# Patient Record
Sex: Male | Born: 1985
Health system: Southern US, Community
[De-identification: ages and names within clinical notes are randomized; demographics above are authoritative.]

## PROBLEM LIST (undated history)

## (undated) DIAGNOSIS — F32A Depression, unspecified: Secondary | ICD-10-CM

## (undated) DIAGNOSIS — F419 Anxiety disorder, unspecified: Secondary | ICD-10-CM

## (undated) DIAGNOSIS — D649 Anemia, unspecified: Secondary | ICD-10-CM

## (undated) DIAGNOSIS — B009 Herpesviral infection, unspecified: Secondary | ICD-10-CM

## (undated) HISTORY — DX: Anemia, unspecified: D64.9

## (undated) HISTORY — DX: Herpesviral infection, unspecified: B00.9

## (undated) HISTORY — DX: Anxiety disorder, unspecified: F41.9

## (undated) HISTORY — DX: Depression, unspecified: F32.A

---

## 2008-02-17 ENCOUNTER — Emergency Department: Payer: Self-pay | Admitting: Emergency Medicine

## 2008-04-17 ENCOUNTER — Emergency Department: Payer: Self-pay | Admitting: Unknown Physician Specialty

## 2008-07-04 ENCOUNTER — Emergency Department: Payer: Self-pay | Admitting: Emergency Medicine

## 2008-09-18 ENCOUNTER — Emergency Department: Payer: Self-pay | Admitting: Emergency Medicine
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
## 2009-12-01 ENCOUNTER — Emergency Department: Payer: Self-pay | Admitting: Emergency Medicine

## 2011-07-11 ENCOUNTER — Ambulatory Visit: Payer: Self-pay | Admitting: Gastroenterology

## 2011-07-12 ENCOUNTER — Ambulatory Visit: Payer: Self-pay | Admitting: Gastroenterology

## 2012-08-19 ENCOUNTER — Encounter (HOSPITAL_COMMUNITY): Payer: Self-pay | Admitting: *Deleted

## 2012-08-19 ENCOUNTER — Emergency Department (HOSPITAL_COMMUNITY)
Admission: EM | Admit: 2012-08-19 | Discharge: 2012-08-19 | Disposition: A | Discharge status: Home / Self Care | Payer: Self-pay | Attending: Emergency Medicine | Admitting: Emergency Medicine

## 2012-08-19 DIAGNOSIS — F17200 Nicotine dependence, unspecified, uncomplicated: Secondary | ICD-10-CM | POA: Insufficient documentation

## 2012-08-19 DIAGNOSIS — J029 Acute pharyngitis, unspecified: Secondary | ICD-10-CM | POA: Insufficient documentation

## 2012-08-19 DIAGNOSIS — R51 Headache: Secondary | ICD-10-CM | POA: Insufficient documentation

## 2012-08-19 LAB — RAPID STREP SCREEN (MED CTR MEBANE ONLY): Streptococcus, Group A Screen (Direct): NEGATIVE

## 2012-08-19 MED ORDER — HYDROCODONE-ACETAMINOPHEN 5-500 MG PO TABS
1.0000 | ORAL_TABLET | Freq: Four times a day (QID) | ORAL | Status: DC | PRN
Start: 2012-08-19 — End: 2023-09-10

## 2012-08-19 NOTE — ED Notes (Signed)
Pt reports 2 day h/o R flank pain, denies any injury, dysuria or dark-colored urine.  Pt denies any urinary or fecal incontinence.

## 2012-08-19 NOTE — ED Provider Notes (Addendum)
History   This chart was scribed for Doug Sou, MD by Toya Smothers, ED Scribe. The patient was seen in room TR05C/TR05C. Patient's care was started at 1344.  CSN: [id number]  Arrival date & time 08/19/12  1344   First MD Initiated Contact with Patient 08/19/12 1437      Chief Complaint  Patient presents with  . Back Pain  . Sore Throat     Patient is a 26 y.o. male presenting with back pain and pharyngitis. The history is provided by the patient. No language interpreter was used.  Back Pain  This is a recurrent problem. The current episode started 2 days ago. The problem occurs constantly. The problem has not changed since onset.The pain is associated with no known injury. The pain is present in the lumbar spine. The quality of the pain is described as stabbing. The pain does not radiate. The pain is moderate. The symptoms are aggravated by twisting and certain positions. The pain is the same all the time. Associated symptoms include headaches. Pertinent negatives include no fever. He has tried nothing for the symptoms. The treatment provided no relief. Risk factors: chronic back pain.  Sore Throat This is a new problem. The current episode started 6 to 12 hours ago. The problem occurs constantly. The problem has not changed since onset.Associated symptoms include headaches. Nothing aggravates the symptoms. Nothing relieves the symptoms. He has tried nothing for the symptoms. The treatment provided no relief.   patient has history of chronic back pain. Correction sore throat started yesterday. Worse with swallowing also missed a cough since yesterday  Mark Bender is a 26 y.o. male who presents to the Emergency Department complaining of 2 days of constant, unchanged, moderate right side lumbar pain. Pain is sharp, aggravated with certain position, and similar to previous episodes of recurrent chronic back pain. No injury mechanism. Pt reports no relief to pain despite the use of  tylenol. He also c/o 12 hours of constant, worsening, moderate sore throat. No fever, chills, cough, congestion, rhinorrhea, chest pain, SOB, or n/v/d. Medical Hx includes chronic lower back pain due to 2 bulging disk. No pertinent surgical Hx listed. Pt is a current everyday smoker and denies consumption of alcohol and use of illicit drugs.   History reviewed. No pertinent past medical history. Past medical history bulging discs in back (lumbar area) History reviewed. No pertinent past surgical history.  History reviewed. No pertinent family history.  History  Substance Use Topics  . Smoking status: Current Every Day Smoker    Types: Cigarettes  . Smokeless tobacco: Not on file  . Alcohol Use: No   Review of Systems  Constitutional: Negative.  Negative for fever.  HENT: Positive for sore throat.   Respiratory: Negative.   Cardiovascular: Negative.   Gastrointestinal: Negative.   Musculoskeletal: Positive for back pain.  Skin: Negative.   Neurological: Positive for headaches.  Hematological: Negative.   Psychiatric/Behavioral: Negative.     Allergies  Review of patient's allergies indicates no known allergies.  Home Medications   Current Outpatient Rx  Name  Route  Sig  Dispense  Refill  . ACETAMINOPHEN 500 MG PO TABS   Oral   Take 500 mg by mouth every 6 (six) hours as needed. For pain           BP 121/72  Pulse 118  Temp 97.6 F (36.4 C) (Oral)  Resp 14  SpO2 98%  Physical Exam  Nursing note and vitals reviewed. Constitutional: He appears  well-developed and well-nourished.  HENT:  Head: Normocephalic and atraumatic.  Mouth/Throat: No oropharyngeal exudate.       Tonsils large and red. Uvula midline. No tonsillar exudate  Eyes: Conjunctivae normal are normal. Pupils are equal, round, and reactive to light.  Neck: Neck supple. No tracheal deviation present. No thyromegaly present.  Cardiovascular: Regular rhythm.   No murmur heard.      Mild tachycardic    Pulmonary/Chest: Effort normal and breath sounds normal.  Abdominal: Soft. Bowel sounds are normal. He exhibits no distension. There is no tenderness.  Musculoskeletal: Normal range of motion. He exhibits no edema.       Mild paralumbar tenderness  Lymphadenopathy:    He has cervical adenopathy.  Neurological: He is alert. Coordination normal.  Skin: Skin is warm and dry. No rash noted.  Psychiatric: He has a normal mood and affect.    ED Course  Procedures  DIAGNOSTIC STUDIES: Oxygen Saturation is 98% on room air, normal by my interpretation.    COORDINATION OF CARE:  14:58-Evaluated Pt. Pt is awake, alert, and without distress. Pt arrived via  Personal transport and will be driving home. 15:00- Patient informed of clinical course, understand medical decision-making process, and agree with plan. Advised Pt to discontinue tobacco use.    Labs Reviewed  RAPID STREP SCREEN   No results found.  Results for orders placed during the hospital encounter of 08/19/12  RAPID STREP SCREEN      Component Value Range   Streptococcus, Group A Screen (Direct) NEGATIVE  NEGATIVE   No results found.  No diagnosis found.   Declined pain medicine in ED MDM  Plan prescription Vicodin Symptoms felt to be viral in etiology Smoking cessation encouraged Referral [facility] [facility] or resource guide Dx #1 pharyngitis #2 low back pain  I personally performed the services described in this documentation, which was scribed in my presence. The recorded information has been reviewed and is accurate.         Doug Sou, MD 08/19/12 1517  Doug Sou, MD 08/19/12 1521

## 2012-08-19 NOTE — ED Notes (Signed)
To ED for eval of lower back pain and sore throat pain. No injury to back. Hx of back pain

## 2015-04-03 ENCOUNTER — Other Ambulatory Visit (HOSPITAL_COMMUNITY): Payer: Self-pay | Admitting: Gastroenterology

## 2015-04-03 DIAGNOSIS — B182 Chronic viral hepatitis C: Secondary | ICD-10-CM

## 2015-04-19 ENCOUNTER — Telehealth (HOSPITAL_COMMUNITY): Payer: Self-pay

## 2015-04-19 NOTE — Telephone Encounter (Signed)
Called to remind pt of 7am appt at [facility] on 04/20/15. Pt agreed to stay NPO in prep for exam. AW

## 2015-04-20 ENCOUNTER — Ambulatory Visit (HOSPITAL_COMMUNITY)
Admission: RE | Admit: 2015-04-20 | Discharge: 2015-04-20 | Disposition: A | Discharge status: Home / Self Care | Payer: 59 | Source: Ambulatory Visit | Attending: Gastroenterology | Admitting: Gastroenterology

## 2015-04-20 DIAGNOSIS — B182 Chronic viral hepatitis C: Secondary | ICD-10-CM | POA: Insufficient documentation

## 2018-10-26 DIAGNOSIS — Z23 Encounter for immunization: Secondary | ICD-10-CM | POA: Diagnosis not present

## 2018-11-02 DIAGNOSIS — F439 Reaction to severe stress, unspecified: Secondary | ICD-10-CM | POA: Diagnosis not present

## 2018-11-09 ENCOUNTER — Other Ambulatory Visit (HOSPITAL_BASED_OUTPATIENT_CLINIC_OR_DEPARTMENT_OTHER): Payer: Self-pay | Admitting: Family Medicine

## 2018-11-09 DIAGNOSIS — N50811 Right testicular pain: Secondary | ICD-10-CM

## 2018-11-09 DIAGNOSIS — B360 Pityriasis versicolor: Secondary | ICD-10-CM | POA: Diagnosis not present

## 2018-11-10 ENCOUNTER — Ambulatory Visit (HOSPITAL_BASED_OUTPATIENT_CLINIC_OR_DEPARTMENT_OTHER)
Admission: RE | Admit: 2018-11-10 | Discharge: 2018-11-10 | Disposition: A | Discharge status: Home / Self Care | Payer: BLUE CROSS/BLUE SHIELD | Source: Ambulatory Visit | Attending: Family Medicine | Admitting: Family Medicine

## 2018-11-10 DIAGNOSIS — N50811 Right testicular pain: Secondary | ICD-10-CM

## 2018-11-19 DIAGNOSIS — F439 Reaction to severe stress, unspecified: Secondary | ICD-10-CM | POA: Diagnosis not present

## 2019-04-16 DIAGNOSIS — M79644 Pain in right finger(s): Secondary | ICD-10-CM | POA: Diagnosis not present

## 2019-09-24 HISTORY — PX: OTHER SURGICAL HISTORY: SHX169

## 2020-05-23 ENCOUNTER — Ambulatory Visit: Payer: Self-pay | Attending: Critical Care Medicine

## 2020-05-23 DIAGNOSIS — Z23 Encounter for immunization: Secondary | ICD-10-CM

## 2020-05-23 NOTE — Progress Notes (Signed)
   Covid-19 Vaccination Clinic  Name:  Mark Bender    MRN: [id number] DOB: [birth year]  05/23/2020  Mr. Mark Bender was observed post Covid-19 immunization for 15 minutes without incident. He was provided with Vaccine Information Sheet and instruction to access the V-Safe system.   Mr. Mark Bender was instructed to call 911 with any severe reactions post vaccine: Marland Kitchen Difficulty breathing  . Swelling of face and throat  . A fast heartbeat  . A bad rash all over body  . Dizziness and weakness   Immunizations Administered    Name Date Dose VIS Date Route   Moderna COVID-19 Vaccine 05/23/2020  1:49 PM 0.5 mL 08/2019 Intramuscular   Manufacturer: Moderna   Lot: [id number]   NDC: 76734-193-79

## 2020-06-20 ENCOUNTER — Ambulatory Visit: Payer: Self-pay

## 2021-01-28 IMAGING — US US SCROTUM W/ DOPPLER COMPLETE
1 series · 14 of 25 positions shown · non-contrast
Comparison: None

CLINICAL DATA: RIGHT testicular pain for 1 week question torsion

EXAM:
SCROTAL ULTRASOUND
DOPPLER ULTRASOUND OF THE TESTICLES
TECHNIQUE: Complete ultrasound examination of the testicles, epididymis, and
other scrotal structures was performed. Color and spectral Doppler
ultrasound were also utilized to evaluate blood flow to the
testicles.

[Series 1: us scrotum w/ doppler complete · 0.07mm/px · 14 of 55 slices shown]
[im 1/55]
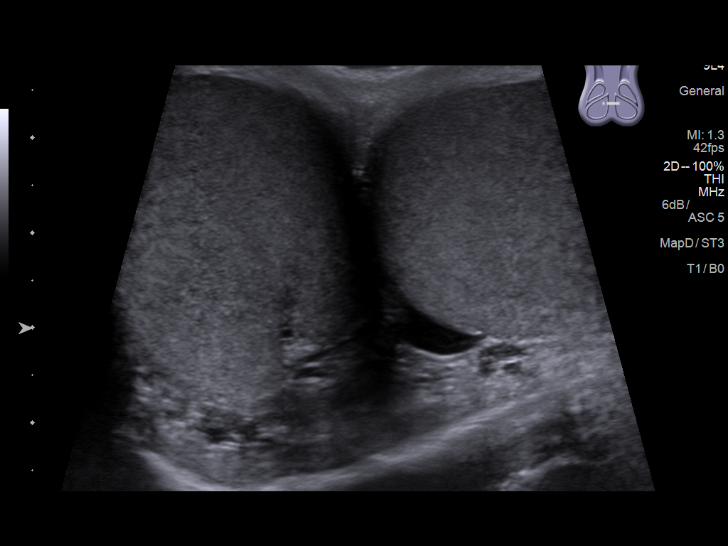
[im 5/55]
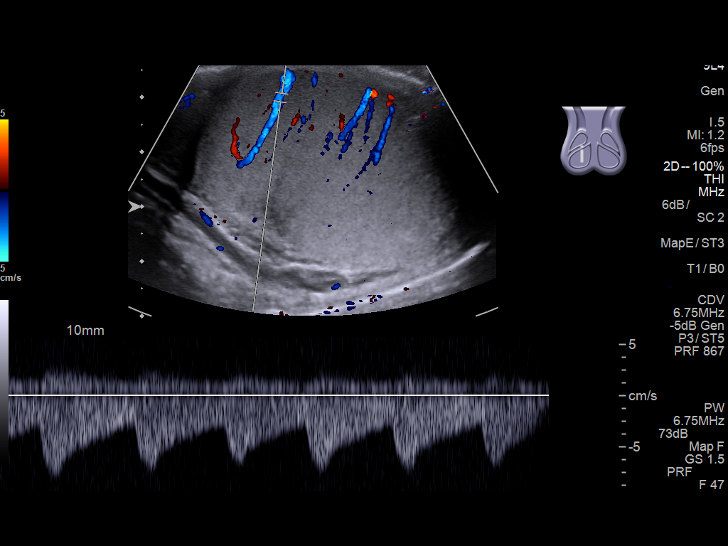
[im 10/55]
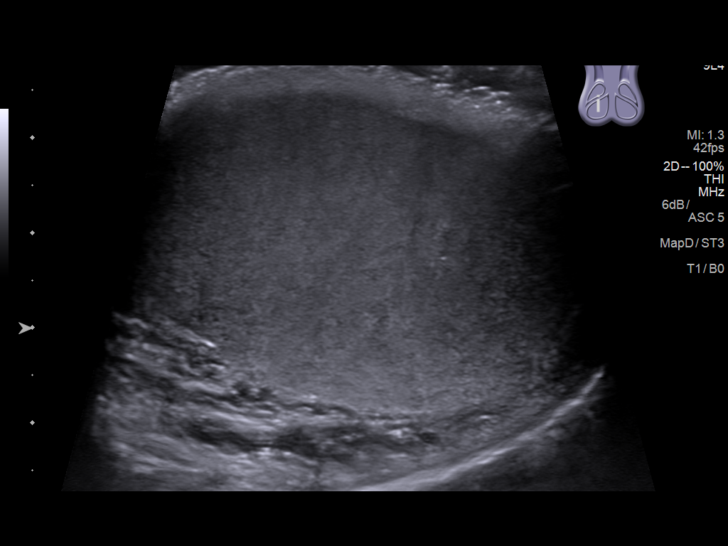
[im 14/55]
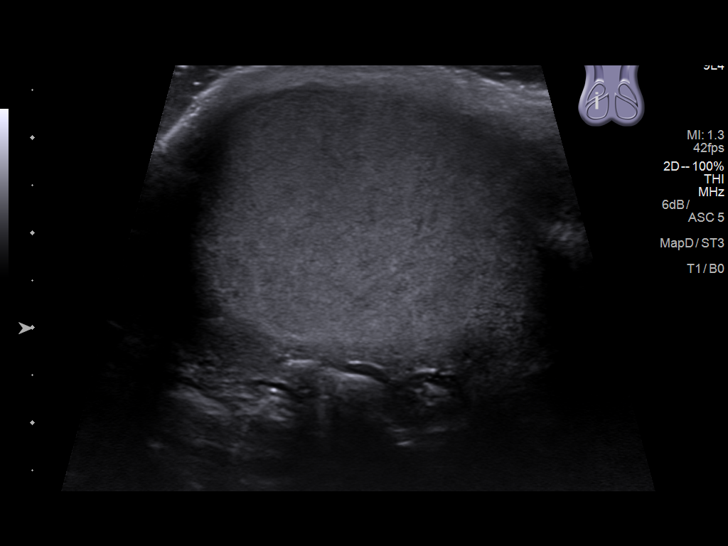
[im 19/55]
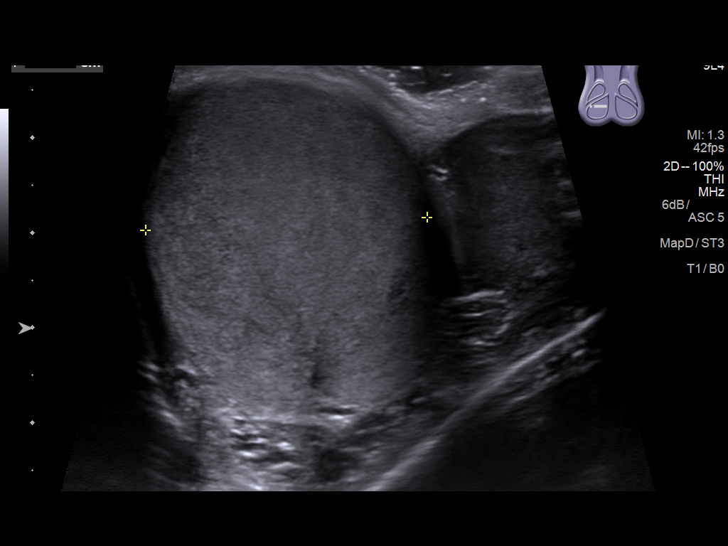
[im 21/55]
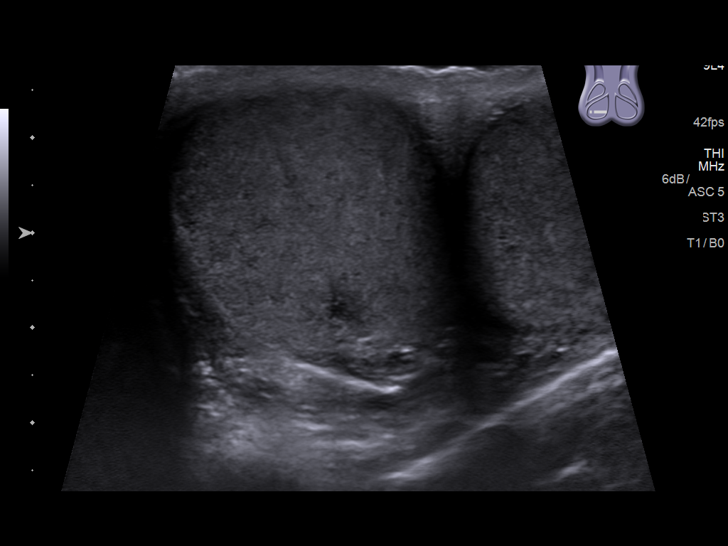
[im 25/55]
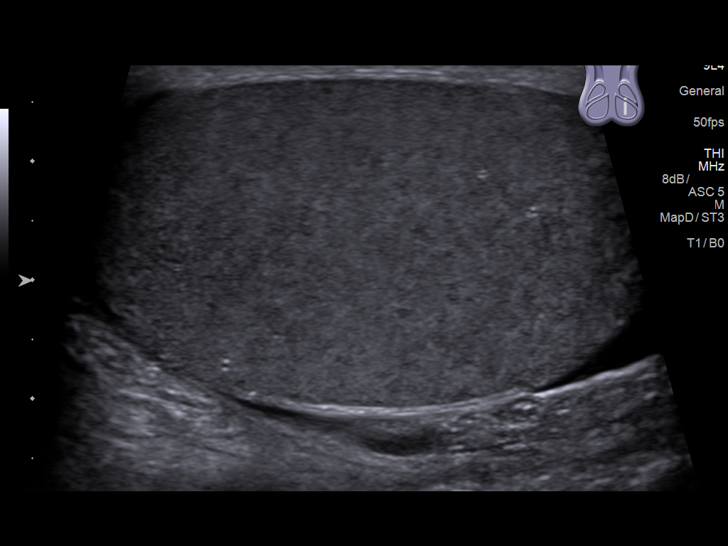
[im 30/55]
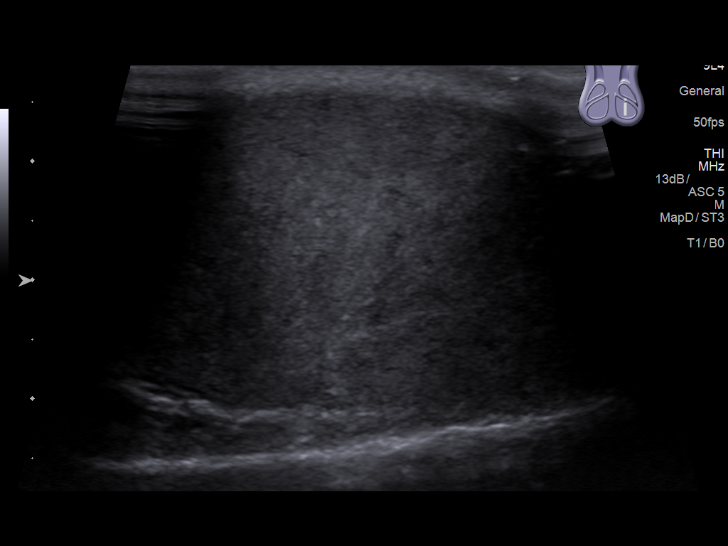
[im 34/55]
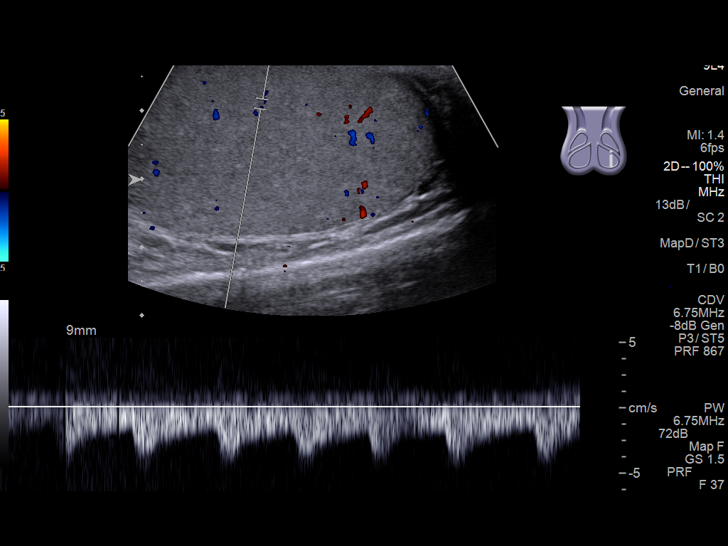
[im 37/55]
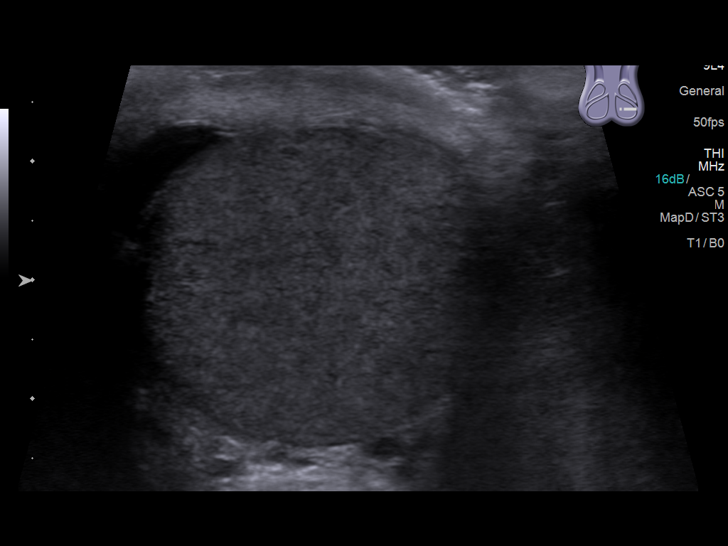
[im 41/55]
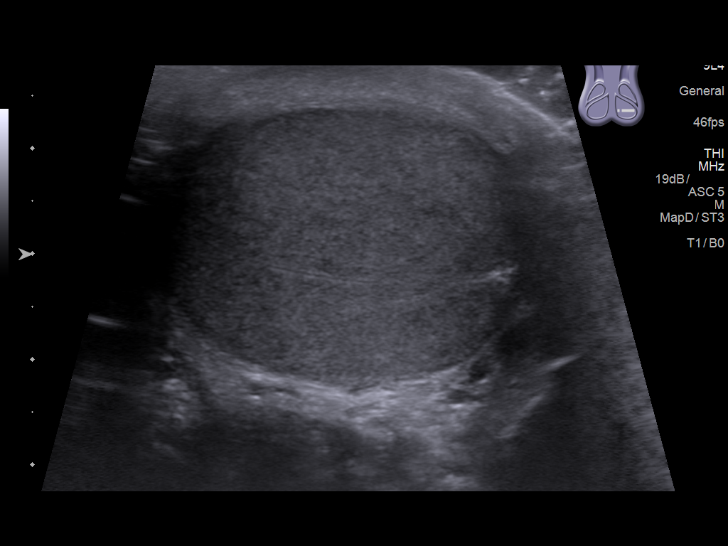
[im 46/55]
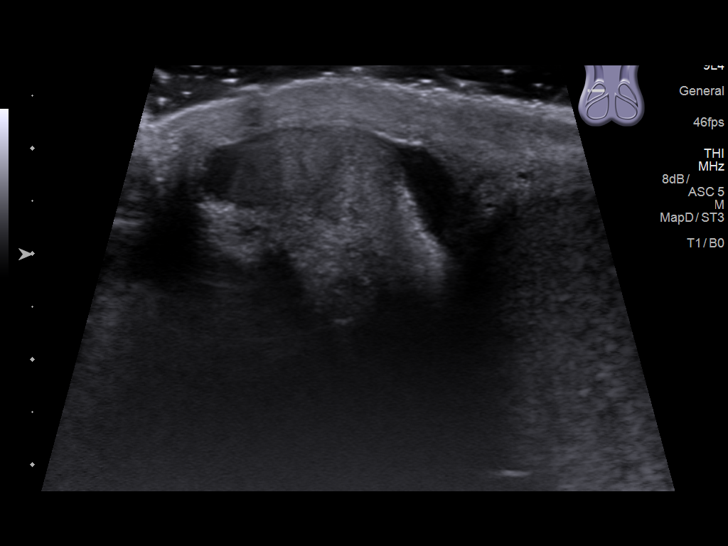
[im 50/55]
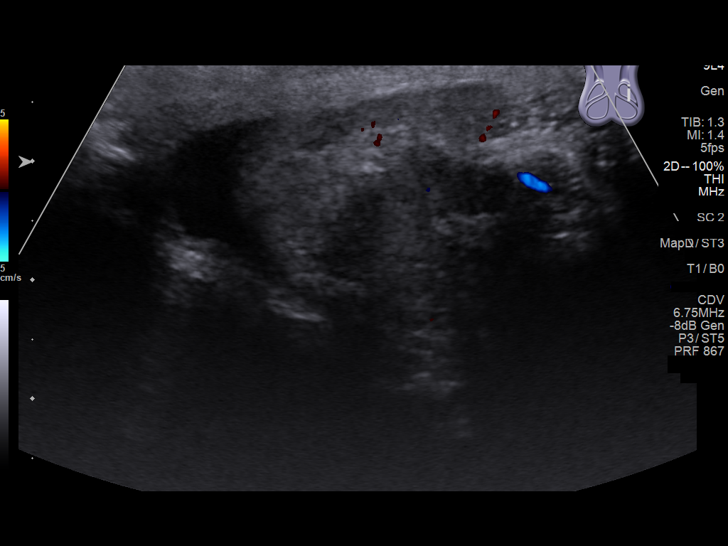
[im 55/55]
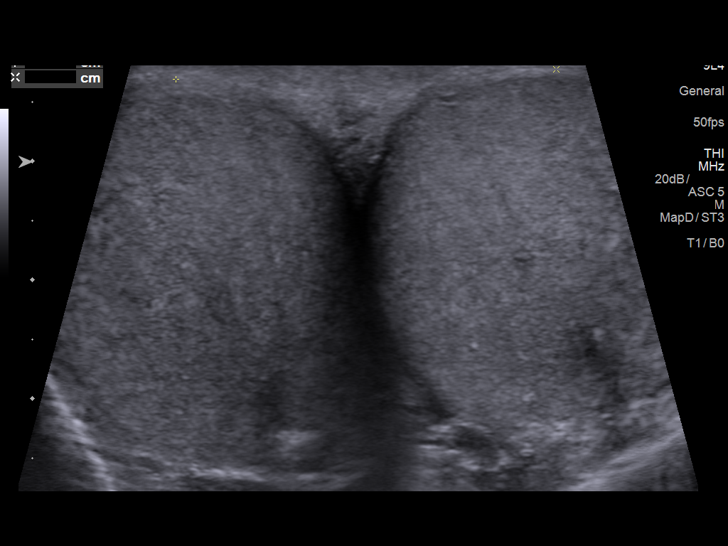

[14 of 25 positions shown; findings below may reference images not displayed]

FINDINGS: Right testicle

Measurements: 4.8 x 3.4 x 3.0 cm. Normal echogenicity without mass
or calcification. Internal blood flow present on color Doppler
imaging.

Left testicle

Measurements: 4.7 x 2.7 x 3.2 cm. Normal echogenicity without mass
or calcification. Internal blood flow present on color Doppler
imaging, similar to RIGHT

Right epididymis:  Normal in size and appearance.

Left epididymis:  Normal in size and appearance.

Hydrocele:  Tiny BILATERAL hydroceles.

Varicocele:  None visualized.

Pulsed Doppler interrogation of both testes demonstrates normal low
resistance arterial and venous waveforms bilaterally.
IMPRESSION: No evidence of testicular mass, torsion, or definite inflammatory
process.

## 2021-10-25 DIAGNOSIS — F1120 Opioid dependence, uncomplicated: Secondary | ICD-10-CM | POA: Diagnosis not present

## 2021-11-01 DIAGNOSIS — M654 Radial styloid tenosynovitis [de Quervain]: Secondary | ICD-10-CM | POA: Diagnosis not present

## 2021-11-01 DIAGNOSIS — M25532 Pain in left wrist: Secondary | ICD-10-CM | POA: Diagnosis not present

## 2021-11-26 DIAGNOSIS — F1120 Opioid dependence, uncomplicated: Secondary | ICD-10-CM | POA: Diagnosis not present

## 2022-01-21 DIAGNOSIS — F1120 Opioid dependence, uncomplicated: Secondary | ICD-10-CM | POA: Diagnosis not present

## 2022-03-27 DIAGNOSIS — Z8659 Personal history of other mental and behavioral disorders: Secondary | ICD-10-CM | POA: Diagnosis not present

## 2022-03-27 DIAGNOSIS — F1120 Opioid dependence, uncomplicated: Secondary | ICD-10-CM | POA: Diagnosis not present

## 2022-04-15 DIAGNOSIS — F3342 Major depressive disorder, recurrent, in full remission: Secondary | ICD-10-CM | POA: Diagnosis not present

## 2022-04-15 DIAGNOSIS — F419 Anxiety disorder, unspecified: Secondary | ICD-10-CM | POA: Diagnosis not present

## 2022-04-15 DIAGNOSIS — H60543 Acute eczematoid otitis externa, bilateral: Secondary | ICD-10-CM | POA: Diagnosis not present

## 2022-04-30 DIAGNOSIS — M654 Radial styloid tenosynovitis [de Quervain]: Secondary | ICD-10-CM | POA: Diagnosis not present

## 2022-05-01 DIAGNOSIS — N451 Epididymitis: Secondary | ICD-10-CM

## 2022-05-01 HISTORY — DX: Epididymitis: N45.1

## 2022-05-02 DIAGNOSIS — R350 Frequency of micturition: Secondary | ICD-10-CM | POA: Diagnosis not present

## 2022-05-02 DIAGNOSIS — N451 Epididymitis: Secondary | ICD-10-CM | POA: Diagnosis not present

## 2022-05-21 DIAGNOSIS — F4323 Adjustment disorder with mixed anxiety and depressed mood: Secondary | ICD-10-CM | POA: Diagnosis not present

## 2022-05-21 DIAGNOSIS — F1911 Other psychoactive substance abuse, in remission: Secondary | ICD-10-CM | POA: Diagnosis not present

## 2022-05-22 DIAGNOSIS — Z8659 Personal history of other mental and behavioral disorders: Secondary | ICD-10-CM | POA: Diagnosis not present

## 2022-05-22 DIAGNOSIS — F1120 Opioid dependence, uncomplicated: Secondary | ICD-10-CM | POA: Diagnosis not present

## 2022-06-04 DIAGNOSIS — F4323 Adjustment disorder with mixed anxiety and depressed mood: Secondary | ICD-10-CM | POA: Diagnosis not present

## 2022-06-18 DIAGNOSIS — F1921 Other psychoactive substance dependence, in remission: Secondary | ICD-10-CM | POA: Diagnosis not present

## 2022-06-18 DIAGNOSIS — F4323 Adjustment disorder with mixed anxiety and depressed mood: Secondary | ICD-10-CM | POA: Diagnosis not present

## 2022-07-16 DIAGNOSIS — F1120 Opioid dependence, uncomplicated: Secondary | ICD-10-CM | POA: Diagnosis not present

## 2022-09-10 DIAGNOSIS — F1120 Opioid dependence, uncomplicated: Secondary | ICD-10-CM | POA: Diagnosis not present

## 2022-09-10 DIAGNOSIS — Z8659 Personal history of other mental and behavioral disorders: Secondary | ICD-10-CM | POA: Diagnosis not present

## 2022-10-25 DIAGNOSIS — F1620 Hallucinogen dependence, uncomplicated: Secondary | ICD-10-CM | POA: Diagnosis not present

## 2022-10-25 DIAGNOSIS — F1390 Sedative, hypnotic, or anxiolytic use, unspecified, uncomplicated: Secondary | ICD-10-CM | POA: Diagnosis not present

## 2022-10-25 DIAGNOSIS — F1220 Cannabis dependence, uncomplicated: Secondary | ICD-10-CM | POA: Insufficient documentation

## 2022-10-25 DIAGNOSIS — F1994 Other psychoactive substance use, unspecified with psychoactive substance-induced mood disorder: Secondary | ICD-10-CM | POA: Diagnosis not present

## 2022-10-25 DIAGNOSIS — F1420 Cocaine dependence, uncomplicated: Secondary | ICD-10-CM | POA: Diagnosis not present

## 2022-10-28 DIAGNOSIS — Z0000 Encounter for general adult medical examination without abnormal findings: Secondary | ICD-10-CM | POA: Diagnosis not present

## 2022-10-28 DIAGNOSIS — F3342 Major depressive disorder, recurrent, in full remission: Secondary | ICD-10-CM | POA: Diagnosis not present

## 2022-10-28 DIAGNOSIS — M545 Low back pain, unspecified: Secondary | ICD-10-CM | POA: Diagnosis not present

## 2022-10-28 DIAGNOSIS — R6882 Decreased libido: Secondary | ICD-10-CM | POA: Diagnosis not present

## 2022-10-28 DIAGNOSIS — F419 Anxiety disorder, unspecified: Secondary | ICD-10-CM | POA: Diagnosis not present

## 2022-11-06 DIAGNOSIS — F1120 Opioid dependence, uncomplicated: Secondary | ICD-10-CM | POA: Diagnosis not present

## 2022-11-06 DIAGNOSIS — Z8659 Personal history of other mental and behavioral disorders: Secondary | ICD-10-CM | POA: Diagnosis not present

## 2022-12-31 DIAGNOSIS — F1120 Opioid dependence, uncomplicated: Secondary | ICD-10-CM | POA: Diagnosis not present

## 2022-12-31 DIAGNOSIS — Z8659 Personal history of other mental and behavioral disorders: Secondary | ICD-10-CM | POA: Diagnosis not present

## 2023-02-11 DIAGNOSIS — F1120 Opioid dependence, uncomplicated: Secondary | ICD-10-CM | POA: Diagnosis not present

## 2023-04-22 DIAGNOSIS — F1120 Opioid dependence, uncomplicated: Secondary | ICD-10-CM | POA: Diagnosis not present

## 2023-05-21 DIAGNOSIS — F1120 Opioid dependence, uncomplicated: Secondary | ICD-10-CM | POA: Diagnosis not present

## 2023-06-17 DIAGNOSIS — F1120 Opioid dependence, uncomplicated: Secondary | ICD-10-CM | POA: Diagnosis not present

## 2023-08-05 DIAGNOSIS — F1120 Opioid dependence, uncomplicated: Secondary | ICD-10-CM | POA: Diagnosis not present

## 2023-08-13 DIAGNOSIS — F1120 Opioid dependence, uncomplicated: Secondary | ICD-10-CM | POA: Diagnosis not present

## 2023-09-09 NOTE — Progress Notes (Unsigned)
   New Patient Office Visit  Subjective    Patient ID: Mark Bender, male    DOB: [birth year]  Age: 37 y.o. MRN: [id number]  CC: No chief complaint on file.   HPI Mark Bender is a 37 y.o. male presents to establish care ***  Outpatient Encounter Medications as of 09/10/2023  Medication Sig   acetaminophen (TYLENOL) 500 MG tablet Take 500 mg by mouth every 6 (six) hours as needed. For pain   HYDROcodone-acetaminophen (VICODIN) 5-500 MG per tablet Take 1-2 tablets by mouth every 6 (six) hours as needed for pain.   No facility-administered encounter medications on file as of 09/10/2023.    No past medical history on file.  No past surgical history on file.  No family history on file.  Social History   Socioeconomic History   Marital status: Unknown    Spouse name: Not on file   Number of children: Not on file   Years of education: Not on file   Highest education level: Not on file  Occupational History   Not on file  Tobacco Use   Smoking status: Every Day    Types: Cigarettes   Smokeless tobacco: Not on file  Substance and Sexual Activity   Alcohol use: No   Drug use: Not on file   Sexual activity: Not on file  Other Topics Concern   Not on file  Social History Narrative   ** Merged History Encounter **       Social Drivers of Health   Financial Resource Strain: Not on file  Food Insecurity: Not on file  Transportation Needs: Not on file  Physical Activity: Not on file  Stress: Not on file  Social Connections: Not on file  Intimate Partner Violence: Not on file    ROS      Objective    There were no vitals taken for this visit.  Physical Exam  {Labs (Optional):23779}    Assessment & Plan:  There are no diagnoses linked to this encounter.   No follow-ups on file.   Modesto Charon, NP

## 2023-09-10 ENCOUNTER — Ambulatory Visit: Payer: BC Managed Care – PPO | Admitting: General Practice

## 2023-09-10 ENCOUNTER — Encounter: Payer: Self-pay | Admitting: General Practice

## 2023-09-10 VITALS — BP 138/84 | HR 68 | Temp 97.8°F | Ht 73.0 in | Wt 175.0 lb

## 2023-09-10 DIAGNOSIS — F32A Depression, unspecified: Secondary | ICD-10-CM

## 2023-09-10 DIAGNOSIS — R0683 Snoring: Secondary | ICD-10-CM | POA: Diagnosis not present

## 2023-09-10 DIAGNOSIS — Z7689 Persons encountering health services in other specified circumstances: Secondary | ICD-10-CM

## 2023-09-10 DIAGNOSIS — B182 Chronic viral hepatitis C: Secondary | ICD-10-CM

## 2023-09-10 DIAGNOSIS — D649 Anemia, unspecified: Secondary | ICD-10-CM | POA: Diagnosis not present

## 2023-09-10 DIAGNOSIS — F419 Anxiety disorder, unspecified: Secondary | ICD-10-CM

## 2023-09-10 DIAGNOSIS — F1220 Cannabis dependence, uncomplicated: Secondary | ICD-10-CM

## 2023-09-10 DIAGNOSIS — Z8619 Personal history of other infectious and parasitic diseases: Secondary | ICD-10-CM

## 2023-09-10 DIAGNOSIS — R350 Frequency of micturition: Secondary | ICD-10-CM

## 2023-09-10 MED ORDER — VALACYCLOVIR HCL 1 G PO TABS: 1000.0000 mg | ORAL_TABLET | Freq: Every day | ORAL | 3 refills | Status: DC

## 2023-09-10 MED ORDER — BUPROPION HCL ER (XL) 300 MG PO TB24: 300.0000 mg | ORAL_TABLET | Freq: Every day | ORAL | 3 refills | Status: DC

## 2023-09-10 NOTE — Assessment & Plan Note (Signed)
Unclear etiology.   Does not want to be referred to urology at this time.   Will continue to monitor.

## 2023-09-10 NOTE — Patient Instructions (Signed)
Referral has been sent for counselor. They will call you with more information.   Refills sent.   Schedule a physical for February.   It was a pleasure meeting you!

## 2023-09-10 NOTE — Assessment & Plan Note (Signed)
Controlled.   Continue valacyclovir 1000 mg once daily. Refill sent.

## 2023-09-10 NOTE — Assessment & Plan Note (Signed)
History of anemia. Controlled.   Will check labs at physical to ensure hemoglobin is WNL.

## 2023-09-10 NOTE — Assessment & Plan Note (Signed)
Controlled.  

## 2023-09-10 NOTE — Assessment & Plan Note (Signed)
Unclear etiology.   Recommended to patient to avoid drinking caffeine after 12.  Referral placed for sleep study.

## 2023-09-10 NOTE — Assessment & Plan Note (Signed)
EMR reviewed briefly.

## 2023-09-10 NOTE — Assessment & Plan Note (Signed)
Controlled.      09/10/2023   12:41 PM  GAD 7 : Generalized Anxiety Score  Nervous, Anxious, on Edge 1  Control/stop worrying 0  Worry too much - different things 0  Trouble relaxing 1  Restless 1  Easily annoyed or irritable 1  Afraid - awful might happen 0  Total GAD 7 Score 4  Anxiety Difficulty Not difficult at all       09/10/2023   12:41 PM 09/10/2023   12:08 PM  PHQ9 SCORE ONLY  PHQ-9 Total Score 5 0   Continue Wellbutrin. Refill sent.  Referral placed for therapy.

## 2023-10-01 DIAGNOSIS — F4323 Adjustment disorder with mixed anxiety and depressed mood: Secondary | ICD-10-CM | POA: Diagnosis not present

## 2023-10-07 DIAGNOSIS — F1120 Opioid dependence, uncomplicated: Secondary | ICD-10-CM | POA: Diagnosis not present

## 2023-10-13 DIAGNOSIS — F4323 Adjustment disorder with mixed anxiety and depressed mood: Secondary | ICD-10-CM | POA: Diagnosis not present

## 2023-11-05 DIAGNOSIS — F4323 Adjustment disorder with mixed anxiety and depressed mood: Secondary | ICD-10-CM | POA: Diagnosis not present

## 2023-11-19 ENCOUNTER — Encounter: Payer: Self-pay | Admitting: Nurse Practitioner

## 2023-11-19 ENCOUNTER — Ambulatory Visit: Payer: BC Managed Care – PPO | Admitting: Nurse Practitioner

## 2023-11-19 VITALS — BP 116/73 | HR 86 | Temp 97.5°F | Ht 73.0 in | Wt 175.8 lb

## 2023-11-19 DIAGNOSIS — G479 Sleep disorder, unspecified: Secondary | ICD-10-CM | POA: Diagnosis not present

## 2023-11-19 DIAGNOSIS — R0683 Snoring: Secondary | ICD-10-CM | POA: Diagnosis not present

## 2023-11-19 DIAGNOSIS — G4719 Other hypersomnia: Secondary | ICD-10-CM | POA: Diagnosis not present

## 2023-11-19 NOTE — Progress Notes (Unsigned)
 @Patient  ID: Mark Bender, male    DOB: 1986-02-25, 38 y.o.   MRN: [id number]  Chief Complaint  Patient presents with   Follow-up    Referring provider: Modesto Charon, NP  HPI: 38 year old male, former smoker referred for sleep consult. Past medical history significant for chronic hepatitis C, anxiety and depression, HSV.  TEST/EVENTS:   11/19/2023: Today - sleep consult Discussed the use of AI scribe software for clinical note transcription with the patient, who gave verbal consent to proceed.  History of Present Illness   Mark Bender is a 38 year old male who presents with sleep disturbances and snoring for a sleep consult.  He experiences intense snoring throughout the night, which he has recorded using an app on his watch. Despite his wife being a heavy sleeper and not disturbed by the snoring, he is concerned about the severity after listening to the recordings. He also experiences significant sleep disturbances, including frequent awakenings, restlessness, and persistent fatigue even after eight hours of sleep.  He experiences daytime sleepiness, particularly during meetings at work when he is not actively speaking, and has a tendency to doze off. He feels drowsy while driving, especially during his commute to [place] three times a week. He has not had any accidents but acknowledges nodding off occasionally during these drives.   There is a family history of sleep apnea, as his father was diagnosed with the condition and used CPAP therapy. He is of a similar build to him.  He has experienced sleep paralysis in the past, but not recently.  His caffeine intake includes approximately 20 ounces of coffee in the morning, usually finished by 9:30 AM, and another cup around 1:30 to 2:00 PM. He denies alcohol consumption. No morning headaches, current sleepwalking, or recent episodes of sleep paralysis.   He goes to bed between 930 and 10 PM.  Falls asleep within 30  minutes.  Wakes 4-6 times a night.  Gets up at around 5 or 6 AM.  Does not operate any heavy machinery in his job field but does have to commute far.  Never had a previous sleep study.  No significant weight change over the last 2 years.  No supplemental oxygen use.  Relatively healthy otherwise.  Lives with his wife and 2 sons.  Works as a Database administrator.  [place] [address]      No Known Allergies  Immunization History  Administered Date(s) Administered   Influenza, Quadrivalent, Recombinant, Inj, Pf 07/04/2022   Influenza,inj,Quad PF,6+ Mos 08/23/2019, 06/07/2020, 08/24/2021   Influenza,inj,Quad PF,6-35 Mos 08/23/2019, 06/07/2020   Influenza-Unspecified 04/24/2023   MMR 10/26/2018   Moderna Sars-Covid-2 Vaccination 09/30/2019, 10/28/2019, 05/23/2020   Tdap 10/26/2018    Past Medical History:  Diagnosis Date   Anemia    Anxiety and depression    Epididymitis 05/01/2022   Herpes simplex type 2 infection     Tobacco History: Social History   Tobacco Use  Smoking Status Former   Types: Cigarettes  Smokeless Tobacco Current  Tobacco Comments   Nicotine pouch    Ready to quit: Not Answered Counseling given: Not Answered Tobacco comments: Nicotine pouch    Outpatient Medications Prior to Visit  Medication Sig Dispense Refill   buPROPion (WELLBUTRIN XL) 300 MG 24 hr tablet Take 1 tablet (300 mg total) by mouth daily. 90 tablet 3   valACYclovir (VALTREX) 1000 MG tablet Take 1 tablet (1,000 mg total) by mouth daily. 90 tablet 3   No facility-administered medications  prior to visit.     Review of Systems:   Constitutional: No weight loss or gain, night sweats, fevers, chills, or lassitude. _+fatigue  HEENT: No headaches, difficulty swallowing, tooth/dental problems, or sore throat. No sneezing, itching, ear ache, nasal congestion, or post nasal drip CV:  No chest pain, orthopnea, PND, swelling in lower extremities, anasarca, dizziness, palpitations, syncope Resp:  +snoring. No shortness of breath with exertion or at rest. No excess mucus or change in color of mucus. No productive or non-productive. No hemoptysis. No wheezing.  No chest wall deformity GI:  No heartburn, indigestion GU: No nocturia MSK:  No joint pain or swelling.   Neuro: No dizziness or lightheadedness.  Psych: No depression or anxiety. Mood stable. +sleep disturbance    Physical Exam:  BP 116/73 (BP Location: Right Arm, Patient Position: Sitting, Cuff Size: Normal)   Pulse 86   Temp (!) 97.5 F (36.4 C) (Temporal)   Ht 6\' 1"  (1.854 m)   Wt 175 lb 12.8 oz (79.7 kg)   SpO2 96%   BMI 23.19 kg/m   GEN: Pleasant, interactive, well-appearing; in no acute distress HEENT:  Normocephalic and atraumatic. PERRLA. Sclera white. Nasal turbinates pink, moist and patent bilaterally. No rhinorrhea present. Oropharynx pink and moist, without exudate or edema. No lesions, ulcerations, or postnasal drip. Mallampati II NECK:  Supple w/ fair ROM. Thyroid symmetrical with no goiter or nodules palpated. No lymphadenopathy.   CV: RRR, no m/r/g, no peripheral edema. Pulses intact, +2 bilaterally. No cyanosis, pallor or clubbing. PULMONARY:  Unlabored, regular breathing. Clear bilaterally A&P w/o wheezes/rales/rhonchi. No accessory muscle use.  GI: BS present and normoactive. Soft, non-tender to palpation. No organomegaly or masses detected.  MSK: No erythema, warmth or tenderness. Cap refil <2 sec all extrem. No deformities or joint swelling noted.  Neuro: A/Ox3. No focal deficits noted.   Skin: Warm, no lesions or rashe Psych: Normal affect and behavior. Judgement and thought content appropriate.     Lab Results:  CBC No results found for: "WBC", "RBC", "HGB", "HCT", "PLT", "MCV", "MCH", "MCHC", "RDW", "LYMPHSABS", "MONOABS", "EOSABS", "BASOSABS"  BMET No results found for: "NA", "K", "CL", "CO2", "GLUCOSE", "BUN", "CREATININE", "CALCIUM", "GFRNONAA", "GFRAA"  BNP No results found for:  "BNP"   Imaging:  No results found.  Administration History     None           No data to display          No results found for: "NITRICOXIDE"      Assessment & Plan:   Snoring He has snoring, excessive daytime sleepiness, restless sleep. Family history of OSA. Given this,  I am concerned he could have sleep disordered breathing with obstructive sleep apnea. He will need sleep study for further evaluation.    - discussed how weight can impact sleep and risk for sleep disordered breathing - discussed options to assist with weight loss: combination of diet modification, cardiovascular and strength training exercises   - had an extensive discussion regarding the adverse health consequences related to untreated sleep disordered breathing - specifically discussed the risks for hypertension, coronary artery disease, cardiac dysrhythmias, cerebrovascular disease, and diabetes - lifestyle modification discussed   - discussed how sleep disruption can increase risk of accidents, particularly when driving - safe driving practices were discussed. Advised to pull over if he has any signs of decreased alertness and avoid driving if drowsy   Patient Instructions  Given your symptoms, I am concerned that you may have sleep disordered  breathing with sleep apnea. You will need a sleep study for further evaluation. Someone will contact you to schedule this.   We discussed how untreated sleep apnea puts an individual at risk for cardiac arrhthymias, pulm HTN, DM, stroke and increases their risk for daytime accidents. We also briefly reviewed treatment options including weight loss, side sleeping position, oral appliance, CPAP therapy or referral to ENT for possible surgical options  Use caution when driving and pull over if you become sleepy.  Follow up in 6 weeks with Katie Shannia Jacuinde,NP to go over sleep study results, or sooner, if needed. Friday PM virtual clinic      Excessive daytime  sleepiness See above   Advised if symptoms do not improve or worsen, to please contact office for sooner follow up or seek emergency care.   I spent 35 minutes of dedicated to the care of this patient on the date of this encounter to include pre-visit review of records, face-to-face time with the patient discussing conditions above, post visit ordering of testing, clinical documentation with the electronic health record, making appropriate referrals as documented, and communicating necessary findings to members of the patients care team.  Noemi Chapel, NP 11/21/2023  Pt aware and understands NP's role.

## 2023-11-19 NOTE — Patient Instructions (Signed)
 Given your symptoms, I am concerned that you may have sleep disordered breathing with sleep apnea. You will need a sleep study for further evaluation. Someone will contact you to schedule this.   We discussed how untreated sleep apnea puts an individual at risk for cardiac arrhthymias, pulm HTN, DM, stroke and increases their risk for daytime accidents. We also briefly reviewed treatment options including weight loss, side sleeping position, oral appliance, CPAP therapy or referral to ENT for possible surgical options  Use caution when driving and pull over if you become sleepy.  Follow up in 6 weeks with Mark Keene Gilkey,NP to go over sleep study results, or sooner, if needed. Friday PM virtual clinic

## 2023-11-21 ENCOUNTER — Encounter: Payer: Self-pay | Admitting: Nurse Practitioner

## 2023-11-21 DIAGNOSIS — G4719 Other hypersomnia: Secondary | ICD-10-CM | POA: Insufficient documentation

## 2023-11-21 NOTE — Assessment & Plan Note (Signed)
 He has snoring, excessive daytime sleepiness, restless sleep. Family history of OSA. Given this,  I am concerned he could have sleep disordered breathing with obstructive sleep apnea. He will need sleep study for further evaluation.    - discussed how weight can impact sleep and risk for sleep disordered breathing - discussed options to assist with weight loss: combination of diet modification, cardiovascular and strength training exercises   - had an extensive discussion regarding the adverse health consequences related to untreated sleep disordered breathing - specifically discussed the risks for hypertension, coronary artery disease, cardiac dysrhythmias, cerebrovascular disease, and diabetes - lifestyle modification discussed   - discussed how sleep disruption can increase risk of accidents, particularly when driving - safe driving practices were discussed. Advised to pull over if he has any signs of decreased alertness and avoid driving if drowsy   Patient Instructions  Given your symptoms, I am concerned that you may have sleep disordered breathing with sleep apnea. You will need a sleep study for further evaluation. Someone will contact you to schedule this.   We discussed how untreated sleep apnea puts an individual at risk for cardiac arrhthymias, pulm HTN, DM, stroke and increases their risk for daytime accidents. We also briefly reviewed treatment options including weight loss, side sleeping position, oral appliance, CPAP therapy or referral to ENT for possible surgical options  Use caution when driving and pull over if you become sleepy.  Follow up in 6 weeks with Katie Landy Dunnavant,NP to go over sleep study results, or sooner, if needed. Friday PM virtual clinic

## 2023-11-21 NOTE — Assessment & Plan Note (Signed)
 See above

## 2023-11-25 DIAGNOSIS — F1120 Opioid dependence, uncomplicated: Secondary | ICD-10-CM | POA: Diagnosis not present

## 2023-11-26 DIAGNOSIS — F4323 Adjustment disorder with mixed anxiety and depressed mood: Secondary | ICD-10-CM | POA: Diagnosis not present

## 2023-12-03 DIAGNOSIS — F4323 Adjustment disorder with mixed anxiety and depressed mood: Secondary | ICD-10-CM | POA: Diagnosis not present

## 2023-12-19 ENCOUNTER — Encounter

## 2023-12-19 ENCOUNTER — Telehealth: Payer: Self-pay | Admitting: Pulmonary Disease

## 2023-12-19 DIAGNOSIS — G4733 Obstructive sleep apnea (adult) (pediatric): Secondary | ICD-10-CM | POA: Diagnosis not present

## 2023-12-19 DIAGNOSIS — R0683 Snoring: Secondary | ICD-10-CM

## 2023-12-19 DIAGNOSIS — G4719 Other hypersomnia: Secondary | ICD-10-CM

## 2023-12-19 DIAGNOSIS — G479 Sleep disorder, unspecified: Secondary | ICD-10-CM

## 2023-12-19 NOTE — Telephone Encounter (Signed)
 Spoke with patient regarding sleep study result's  Call patient   Sleep study result   Date of study: 12/08/2023   Impression: Mild obstructive sleep apnea with AHI of 9.0   Recommendation: Options of treatment for mild obstructive sleep apnea will include   1.  CPAP therapy if there is significant daytime sleepiness or other comorbidities including history of CVA or cardiac disease -If CPAP is chosen as an option of treatment auto titrating CPAP with a pressure setting of 5-15 will be appropriate   2.  Watchful waiting with emphasis on weight loss measures, sleep position modification to optimize lateral sleep, elevating the head of the bed by about 30 degrees may also help.   3.  An oral device may be fashioned for the treatment of mild sleep disordered breathing, will involve referral to dentist.     Follow-up as previously scheduled   Patient does have a mychart visit 01/15/2024. Patient's voice was understanding.Nothing else further needed.

## 2023-12-19 NOTE — Telephone Encounter (Signed)
 Call patient  Sleep study result  Date of study: 12/08/2023  Impression: Mild obstructive sleep apnea with AHI of 9.0  Recommendation: Options of treatment for mild obstructive sleep apnea will include  1.  CPAP therapy if there is significant daytime sleepiness or other comorbidities including history of CVA or cardiac disease -If CPAP is chosen as an option of treatment auto titrating CPAP with a pressure setting of 5-15 will be appropriate  2.  Watchful waiting with emphasis on weight loss measures, sleep position modification to optimize lateral sleep, elevating the head of the bed by about 30 degrees may also help.  3.  An oral device may be fashioned for the treatment of mild sleep disordered breathing, will involve referral to dentist.   Follow-up as previously scheduled

## 2023-12-24 DIAGNOSIS — F4323 Adjustment disorder with mixed anxiety and depressed mood: Secondary | ICD-10-CM | POA: Diagnosis not present

## 2024-01-07 DIAGNOSIS — F4323 Adjustment disorder with mixed anxiety and depressed mood: Secondary | ICD-10-CM | POA: Diagnosis not present

## 2024-01-15 ENCOUNTER — Telehealth: Payer: BC Managed Care – PPO | Admitting: Nurse Practitioner

## 2024-01-15 DIAGNOSIS — G4733 Obstructive sleep apnea (adult) (pediatric): Secondary | ICD-10-CM

## 2024-01-15 DIAGNOSIS — Z87891 Personal history of nicotine dependence: Secondary | ICD-10-CM

## 2024-01-15 NOTE — Progress Notes (Unsigned)
 Patient ID: Mark Bender, male     DOB: 1986/04/24, 38 y.o.      MRN: [id number]  No chief complaint on file.   Virtual Visit via Video Note  I connected with Mark Bender on 01/16/24 at  2:30 PM EDT by a video enabled telemedicine application and verified that I am speaking with the correct person using two identifiers.  Location: Patient: Home Provider: Office    I discussed the limitations of evaluation and management by telemedicine and the availability of in person appointments. The patient expressed understanding and agreed to proceed.  History of Present Illness: 38 year old male, former smoker followed for sleep apnea. Past medical history significant for chronic hepatitis C, anxiety and depression, HSV.   TEST/EVENTS:  12/08/2023 HST: AHI 9, SpO2 low 85%  11/19/2023: Ov with Doc Mandala NP Discussed the use of AI scribe software for clinical note transcription with the patient, who gave verbal consent to proceed. Mark Bender is a 38 year old male who presents with sleep disturbances and snoring for a sleep consult. He experiences intense snoring throughout the night, which he has recorded using an app on his watch. Despite his wife being a heavy sleeper and not disturbed by the snoring, he is concerned about the severity after listening to the recordings. He also experiences significant sleep disturbances, including frequent awakenings, restlessness, and persistent fatigue even after eight hours of sleep. He experiences daytime sleepiness, particularly during meetings at work when he is not actively speaking, and has a tendency to doze off. He feels drowsy while driving, especially during his commute to [place] three times a week. He has not had any accidents but acknowledges nodding off occasionally during these drives.  There is a family history of sleep apnea, as his father was diagnosed with the condition and used CPAP therapy. He is of a similar build to  him. He has experienced sleep paralysis in the past, but not recently. His caffeine intake includes approximately 20 ounces of coffee in the morning, usually finished by 9:30 AM, and another cup around 1:30 to 2:00 PM. He denies alcohol consumption. No morning headaches, current sleepwalking, or recent episodes of sleep paralysis.  He goes to bed between 930 and 10 PM.  Falls asleep within 30 minutes.  Wakes 4-6 times a night.  Gets up at around 5 or 6 AM.  Does not operate any heavy machinery in his job field but does have to commute far.  Never had a previous sleep study.  No significant weight change over the last 2 years.  No supplemental oxygen use.  Relatively healthy otherwise. Lives with his wife and 2 sons.  Works as a Database administrator. [employer]  01/15/2024: Today - follow up Discussed the use of AI scribe software for clinical note transcription with the patient, who gave verbal consent to proceed.  History of Present Illness   Mark Bender is a 38 year old male who presents for follow up after home sleep study, which revealed mild OSA.   He experiences ongoing sleep disturbances characterized by intense snoring and significant daytime sleepiness. Despite adequate sleep duration, he feels persistently tired during the day. He has recorded himself snoring, which corroborates his symptoms.  A home sleep study revealed mild obstructive sleep apnea.   He is exploring treatment options for his sleep apnea, including the use of an oral appliance.       No Known Allergies Immunization History  Administered Date(s)  Administered   Influenza, Quadrivalent, Recombinant, Inj, Pf 07/04/2022   Influenza,inj,Quad PF,6+ Mos 08/23/2019, 06/07/2020, 08/24/2021   Influenza,inj,Quad PF,6-35 Mos 08/23/2019, 06/07/2020   Influenza-Unspecified 04/24/2023   MMR 10/26/2018   Moderna Sars-Covid-2 Vaccination 09/30/2019, 10/28/2019, 05/23/2020   Tdap 10/26/2018   Past Medical History:   Diagnosis Date   Anemia    Anxiety and depression    Epididymitis 05/01/2022   Herpes simplex type 2 infection     Tobacco History: Social History   Tobacco Use  Smoking Status Former   Types: Cigarettes  Smokeless Tobacco Current  Tobacco Comments   Nicotine pouch    Ready to quit: Not Answered Counseling given: Not Answered Tobacco comments: Nicotine pouch    Outpatient Medications Prior to Visit  Medication Sig Dispense Refill   buPROPion  (WELLBUTRIN  XL) 300 MG 24 hr tablet Take 1 tablet (300 mg total) by mouth daily. 90 tablet 3   valACYclovir  (VALTREX ) 1000 MG tablet Take 1 tablet (1,000 mg total) by mouth daily. 90 tablet 3   No facility-administered medications prior to visit.     Review of Systems:   Constitutional: No weight loss or gain, night sweats, fevers, chills, or lassitude. _+fatigue  HEENT: No headaches, difficulty swallowing, tooth/dental problems, or sore throat. No sneezing, itching, ear ache, nasal congestion, or post nasal drip CV:  No chest pain, orthopnea, PND Resp: +snoring. No shortness of breath with exertion or at rest.  GI:  No heartburn, indigestion GU: No nocturia MSK:  No joint pain or swelling.   Neuro: No dizziness or lightheadedness.  Psych: No depression or anxiety. Mood stable. +sleep disturbance  Observations/Objective: Patient is well-developed, well-nourished in no acute distress.  Resting comfortably at home.  No labored breathing.  Speech is clear and coherent with logical content.  Patient is alert and oriented at baseline.   Assessment and Plan: Mild obstructive sleep apnea Mild OSA with significant daytime burden.  Minimal cardiovascular risks associated with mild sleep apnea.  Discussed potential treatment options including oral appliance or CPAP therapy.  BMI normal.  He has opted to look at oral appliance.  If this is not an affordable option, he would then consider CPAP.  He is already discussed having an oral  appliance made with his dentist at [facility] and [address].  Needs OV notes and home sleep study faxed to them.  He will notify if he needs anything else from us  in the interim.  Safe driving practices reviewed.  Patient Instructions  Your sleep study showed mild sleep apnea. Mild sleep apnea poses minimal cardiovascular health risks, but some people are more sensitive to it than others and have burdensome symptoms as a result.  We also briefly reviewed treatment options including weight loss, side sleeping position, oral appliance, CPAP therapy  You have opted for oral appliance - I will route the home sleep study and office notes to your requested dentist at [facility]   Let me know if you have any issues obtaining this due to insurance coverage, or if you decide you'd like to try CPAP instead   Follow up in 6 months with Gina Lagos, or sooner, if needed   [facility]  Dental Sleep Medicine Fax [phone]    I discussed the assessment and treatment plan with the patient. The patient was provided an opportunity to ask questions and all were answered. The patient agreed with the plan and demonstrated an understanding of the instructions.   The patient was advised  to call back or seek an in-person evaluation if the symptoms worsen or if the condition fails to improve as anticipated.  I provided 31 minutes of non-face-to-face time during this encounter.   Roetta Clarke, NP

## 2024-01-15 NOTE — Patient Instructions (Signed)
 Your sleep study showed mild sleep apnea. Mild sleep apnea poses minimal cardiovascular health risks, but some people are more sensitive to it than others and have burdensome symptoms as a result.  We also briefly reviewed treatment options including weight loss, side sleeping position, oral appliance, CPAP therapy  You have opted for oral appliance - I will route the home sleep study and office notes to your requested dentist at [facility]   Let me know if you have any issues obtaining this due to insurance coverage, or if you decide you'd like to try CPAP instead   Follow up in 6 months with Katie Nakea Gouger,NP, or sooner, if needed

## 2024-01-16 ENCOUNTER — Encounter: Payer: Self-pay | Admitting: Nurse Practitioner

## 2024-01-16 DIAGNOSIS — G4733 Obstructive sleep apnea (adult) (pediatric): Secondary | ICD-10-CM | POA: Insufficient documentation

## 2024-01-16 NOTE — Assessment & Plan Note (Signed)
 Mild OSA with significant daytime burden.  Minimal cardiovascular risks associated with mild sleep apnea.  Discussed potential treatment options including oral appliance or CPAP therapy.  BMI normal.  He has opted to look at oral appliance.  If this is not an affordable option, he would then consider CPAP.  He is already discussed having an oral appliance made with his dentist at [facility] and [address].  Needs OV notes and home sleep study faxed to them.  He will notify if he needs anything else from us  in the interim.  Safe driving practices reviewed.  Patient Instructions  Your sleep study showed mild sleep apnea. Mild sleep apnea poses minimal cardiovascular health risks, but some people are more sensitive to it than others and have burdensome symptoms as a result.  We also briefly reviewed treatment options including weight loss, side sleeping position, oral appliance, CPAP therapy  You have opted for oral appliance - I will route the home sleep study and office notes to your requested dentist at [facility]   Let me know if you have any issues obtaining this due to insurance coverage, or if you decide you'd like to try CPAP instead   Follow up in 6 months with Gina Lagos, or sooner, if needed   [facility]  Dental Sleep Medicine Fax [phone]

## 2024-01-27 DIAGNOSIS — F1120 Opioid dependence, uncomplicated: Secondary | ICD-10-CM | POA: Diagnosis not present

## 2024-01-28 NOTE — Telephone Encounter (Signed)
 Can we check on this?

## 2024-01-30 DIAGNOSIS — F4323 Adjustment disorder with mixed anxiety and depressed mood: Secondary | ICD-10-CM | POA: Diagnosis not present

## 2024-02-10 ENCOUNTER — Telehealth (HOSPITAL_BASED_OUTPATIENT_CLINIC_OR_DEPARTMENT_OTHER): Payer: Self-pay

## 2024-02-10 NOTE — Telephone Encounter (Signed)
 Pt.notified

## 2024-02-10 NOTE — Telephone Encounter (Signed)
 Copied from CRM [id number]. Topic: Referral - Question >> Feb 09, 2024  4:53 PM Jethro Morrison wrote: Reason for CRM: PT CALLED STATED STILL NO INFORMATION ON  REFERRAL TO ORTHODONTICS, IT IS SHOWING CLOSED IN THE SYSTEM. THANKS

## 2024-02-19 DIAGNOSIS — F4323 Adjustment disorder with mixed anxiety and depressed mood: Secondary | ICD-10-CM | POA: Diagnosis not present

## 2024-02-23 DIAGNOSIS — F1120 Opioid dependence, uncomplicated: Secondary | ICD-10-CM | POA: Diagnosis not present

## 2024-03-08 ENCOUNTER — Other Ambulatory Visit: Payer: Self-pay

## 2024-03-08 ENCOUNTER — Telehealth: Payer: Self-pay

## 2024-03-08 DIAGNOSIS — G4733 Obstructive sleep apnea (adult) (pediatric): Secondary | ICD-10-CM

## 2024-03-08 NOTE — Telephone Encounter (Signed)
 We use Dr. Ardell Koller or Dr. Abel Abelson in [place]. Ok to send updated referral to either. Thanks.

## 2024-03-08 NOTE — Telephone Encounter (Signed)
 Patient is having issue with  order placed to orthodontics and wants to know whether we have other referral recommendation in [place] or [place] to send his order to.

## 2024-03-11 NOTE — Telephone Encounter (Signed)
 Referral was put in for Dr Ardell Koller in [place]. Called patient to let him know, someone will be contacting him.

## 2024-03-17 DIAGNOSIS — F4323 Adjustment disorder with mixed anxiety and depressed mood: Secondary | ICD-10-CM | POA: Diagnosis not present

## 2024-03-19 ENCOUNTER — Telehealth: Payer: Self-pay | Admitting: General Practice

## 2024-03-19 DIAGNOSIS — F32A Anxiety disorder, unspecified: Secondary | ICD-10-CM

## 2024-03-19 NOTE — Telephone Encounter (Signed)
 Another referral placed.  Please advise patient.

## 2024-03-19 NOTE — Addendum Note (Signed)
 Addended by: VINCENTE SHIVERS on: 03/19/2024 02:24 PM   Modules accepted: Orders

## 2024-03-19 NOTE — Telephone Encounter (Signed)
 Spoke with patient and looks like his referral was closed after attempted contact was made and unsuccessful in January. Patient was also supposed to return in [place] for CPE but did not. I advised patient he needs appt for recheck/CPE; tried to schedule patient appt for next week but his schedule does not match any openings we have left next week. Patient works in [employer] and can not get here at scheduled times. I scheduled CPE for 05/05/24 at 12 pm and advised I would ask Carrol if referral can be re-done at least?

## 2024-03-19 NOTE — Telephone Encounter (Signed)
 Copied from CRM [id number]. Topic: Referral - Status >> Mar 18, 2024  4:34 PM Charolett L wrote: Reason for CRM: As per office visit on 09/10/23 Dr. Vincente referred patient to a counselor and wanted a the counselor to give him a call or send him a copy of the referral

## 2024-03-19 NOTE — Telephone Encounter (Signed)
 Spoke with patient and advised referral was done. Patient thanked us  and stated they had already called him to set up his appt.

## 2024-03-23 DIAGNOSIS — F1120 Opioid dependence, uncomplicated: Secondary | ICD-10-CM | POA: Diagnosis not present

## 2024-03-31 DIAGNOSIS — F4323 Adjustment disorder with mixed anxiety and depressed mood: Secondary | ICD-10-CM | POA: Diagnosis not present

## 2024-04-14 ENCOUNTER — Telehealth: Payer: Self-pay

## 2024-04-14 DIAGNOSIS — G4733 Obstructive sleep apnea (adult) (pediatric): Secondary | ICD-10-CM

## 2024-04-14 DIAGNOSIS — F4323 Adjustment disorder with mixed anxiety and depressed mood: Secondary | ICD-10-CM | POA: Diagnosis not present

## 2024-04-14 NOTE — Telephone Encounter (Signed)
 Katie,   Please advise cpap settings if you are okay with cpap order.

## 2024-04-14 NOTE — Telephone Encounter (Signed)
 Ok to start CPAP. Please place order for new start 5-15 cmH2O, mask of choice, heated humidity. Educate on proper use/care of device. Needs f/u with me in 10-12 weeks. Thanks.

## 2024-04-23 ENCOUNTER — Ambulatory Visit (INDEPENDENT_AMBULATORY_CARE_PROVIDER_SITE_OTHER): Admitting: Clinical

## 2024-04-23 DIAGNOSIS — F1121 Opioid dependence, in remission: Secondary | ICD-10-CM | POA: Diagnosis not present

## 2024-04-23 DIAGNOSIS — F419 Anxiety disorder, unspecified: Secondary | ICD-10-CM

## 2024-04-23 NOTE — Progress Notes (Signed)
   Mark Barthel, LCSW

## 2024-04-23 NOTE — Progress Notes (Signed)
 [facility] Counselor Initial Adult Exam  Name: SAMANTHA OLIVERA Date: 04/23/2024 MRN: [id number] DOB: 06-18-1986 PCP: Vincente Shivers, NP  Time spent: 11:34am - 12:30pm   Guardian/Payee:  NA    Paperwork requested: NA  Reason for Visit [place] Problem: Patient stated, I went through counseling services before and I always kind of fall off, a lot of things prompted me setting this appointment, I do take antidepressants, I am also a recovering addict, I had relapsed a while back. Patient stated, I have a pattern of lying or deceiving, not being truthful, especially in my marriage, a lot of that stems from fear of the consequences of being truthful, also I wasn't ready to stop MAT treatment. Patient stated, my wife is not ok with me continuing MAT and Im not either, I haven't had any support doing that. Patient stated, that's what initially prompted me to set this appointment, there are a lot of things I need to work on. Patient stated, I need some help with getting off of suboxone. Patient stated, I want to save my marriage and I want to work on my relationship with my wife,   Mental Status Exam: Appearance:   Neat and Well Groomed     Behavior:  Appropriate  Motor:  Normal  Speech/Language:   Clear and Coherent and Normal Rate  Affect:  Appropriate  Mood:  normal  Thought process:  normal  Thought content:    WNL  Sensory/Perceptual disturbances:    WNL  Orientation:  oriented to person, place, time/date, and situation  Attention:  Good  Concentration:  Good  Memory:  WNL  Fund of knowledge:   Good  Insight:    Good  Judgment:   Good  Impulse Control:  Good   Reported Symptoms: Patient reported I have pretty bad anxiety, destructively withdrawing from everything, isolation, loneliness, stated I get really complacent with being lonely, fear of judgement from others, irritability, recently diagnosed with sleep apnea, fatigue, waking up  not feeling rested, wakes up multiple times at night to go to the restroom, stated overall apathetic disposition to life, my mood most days everything's just ok, now everything seems so monotonous, decreased focus  Risk Assessment: Danger to Self:  No Patient denied current and past suicidal ideation and symptoms of psychosis Self-injurious Behavior: No Danger to Others: No Patient denied current and past homicidal ideation Duty to Warn:no Physical Aggression / Violence:No  Access to Firearms a concern: No  Gang Involvement:No  Patient / guardian was educated about steps to take if suicide or homicide risk level increases between visits: yes While future psychiatric events cannot be accurately predicted, the patient does not currently require acute inpatient psychiatric care and does not currently meet   involuntary commitment criteria.  Substance Abuse History: Current substance abuse: [facility], Lauraine Fanny, NP and has been receiving MAT therapy for 4 years. Patient reported currently using nicotine pouches multiple times per day with last use today. Patient reported no current alcohol use. Patient reported a history of alcohol use and stated, when I drank I drank to get drunk. Patient reported alcohol use was occasional use with last use 5 years ago. Patient reported current suboxone use of 6 mg daily through [facility]. Patient reported a history of using cannabis, benzodiazepines, opiates, shrooms, and acid in middle school and reported increased opioid use during patient's junior/senior year of high school. Patient reported a history of using cocaine and ecstasy during patient's  early 20's and reported using heroin (IV), methadone for 2 years, history of suboxone treatment prior to current treatment. Patient reported patient previously moved into [facility], started attending NA meetings regularly and stated,  stayed cleaned for a long period of time. Patient reported in 2020 patient was given fentanyl at the hospital due to a broken hand and was prescribed hydrocodone  at discharge. Patient reported patient started using kratom regularly after patient's hand was broken. Patient reported patient has tried to use kratom while receiving suboxone therapy. Patient reported a history of withdrawal symptoms from opiates.   Past Psychiatric History:   Previous psychological history is significant for anxiety, depression, and substance abuse Outpatient Providers: Patient reported a history of participation in individual therapy, currently in marriage counseling, history of inpatient treatment at [facility] in [place] for detox 2-3 times, history of hospitalization at [facility] for 3 weeks, history of 90 day residential treatment at [facility] History of Psych Hospitalization: Yes  Psychological Testing: none   Abuse History:  Victim of: No., none   Report needed: No. Victim of Neglect:No. Perpetrator of none reported  Witness / Exposure to Domestic Violence: No   Protective Services Involvement: No  Witness to [employer] Violence:  No   Family History:  Family History  Problem Relation Age of Onset   Anxiety disorder Mother    Diabetes Father    Heart disease Father    Parkinson's disease Father    Sleep apnea Father    Asthma Brother     Living situation: the patient lives with their family  Sexual Orientation: Straight  Relationship Status: married  Name of spouse / other: Sharyne Maddie If a parent, number of children / ages: 2 children   Support Systems: sponsor, friends in recovery, NA community  Financial Stress:  Yes   Income/Employment/Disability: Employment  Financial planner: No   Educational History: Education: Risk manager: Patient stated, I believe in God, I believe in a higher power  Any cultural differences that may affect / interfere with  treatment:  none  Recreation/Hobbies: mountain biking, going to the gym/weight training  Stressors: Marital or family conflict   Other: parenting    Strengths: family support, NA community, supportive relationships  Barriers:  Patient reported the suboxone and would like to discontinue suboxone by December   Legal History: Pending legal issue / charges: no current/pending charges. History of legal issue / charges: obtaining property by false pretense - felony, DUI's, possession charges  Medical History/Surgical History: reviewed Past Medical History:  Diagnosis Date   Anemia    Anxiety and depression    Epididymitis 05/01/2022   Herpes simplex type 2 infection     Past Surgical History:  Procedure Laterality Date   Metacarpal repair Left 2021    Medications: Current Outpatient Medications  Medication Sig Dispense Refill   buPROPion  (WELLBUTRIN  XL) 300 MG 24 hr tablet Take 1 tablet (300 mg total) by mouth daily. 90 tablet 3   valACYclovir  (VALTREX ) 1000 MG tablet Take 1 tablet (1,000 mg total) by mouth daily. 90 tablet 3   No current facility-administered medications for this visit.    No Known Allergies  Diagnoses:  Opioid dependence in early, early partial, sustained full, or sustained partial remission (HCC) - Opioid Dependence, severe in early remission, on maintenance therapy  Unspecified Anxiety Disorder  Plan of Care: Patient is a 38 year old male who presented for an initial assessment. Clinician conducted initial assessment via caregility video from clinician's home office.  Patient provided verbal consent to proceed with telehealth session and is aware of limitations of telephone or video visits. Patient participated in session from patient's home. Patient reported the following symptoms: anxiety, withdraw/isolation, loneliness, fear of judgement from others, irritability, fatigue, waking up not feeling rested, wakes up multiple times at night to go to the  restroom, decreased focus, and stated overall apathetic disposition to life, my mood most days everything's just ok, now everything seems so monotonous. Patient denied current and past suicidal ideation, homicidal ideation, and symptoms of psychosis. Patient reported patient is currently receiving suboxone treatment through [facility]. Patient reported currently using nicotine patches daily. Patient reported a history of using multiple substances. Patient reported a history of participation in individual therapy, history of inpatient treatment at [facility] in [place] 2-3 times, history of hospitalization at [facility] for 3 weeks, and history of 90 day residential treatment at [facility]. Patient reported patient and wife are currently participating in marriage counseling. Patient reported patient currently attends 12 step program, NA, and has a sponsor. Patient reported marital conflict and parenting are current stressors. Patient identified patient's sponsor, friends in recovery, and the NA community as patient's support system.  It is recommended patient be referred to a psychiatrist for a consultation and recommended patient participate in individual therapy with a provider that has expertise in Medication (suboxone) Assisted Treatment. In addition, it is recommended patient and wife continue to participate in marriage counseling and patient continue utilizing current supports, such as, patient's NA sponsor and attending NA meetings.  Clinician will review recommendations and treatment plan with patient during follow up appointment and provide referral information.  Collaboration of Care: Other Patient declined consents at this time  Patient/Guardian was advised Release of Information must be obtained prior to any record release in order to collaborate their care with an outside provider. Patient/Guardian was advised if they have not already done so to contact [facility] to sign all necessary  forms in order for us  to release information regarding their care.   Consent: Patient/Guardian gives verbal consent for treatment and assignment of benefits for services provided during this visit. Patient/Guardian expressed understanding and agreed to proceed.   Darice Seats, LCSW

## 2024-04-23 NOTE — Telephone Encounter (Signed)
 ATC X1. LMTCB. CPAP order was placed.

## 2024-04-28 DIAGNOSIS — F4323 Adjustment disorder with mixed anxiety and depressed mood: Secondary | ICD-10-CM | POA: Diagnosis not present

## 2024-04-30 NOTE — Telephone Encounter (Signed)
 Copied from CRM [id number]. Topic: General - Other >> Apr 29, 2024  4:51 PM Rilla B wrote: Patient returning call to [place]. Please call.  Called and spoke with the patient, advised CPAP order has been placed. I have scheduled pt cpap f/u appt & pt is aware. Nfn

## 2024-05-05 ENCOUNTER — Ambulatory Visit: Admitting: General Practice

## 2024-05-05 ENCOUNTER — Encounter: Payer: Self-pay | Admitting: General Practice

## 2024-05-05 ENCOUNTER — Ambulatory Visit: Payer: Self-pay | Admitting: General Practice

## 2024-05-05 VITALS — BP 110/62 | HR 71 | Temp 98.7°F | Ht 73.0 in | Wt 175.0 lb

## 2024-05-05 DIAGNOSIS — G4733 Obstructive sleep apnea (adult) (pediatric): Secondary | ICD-10-CM | POA: Diagnosis not present

## 2024-05-05 DIAGNOSIS — Z13220 Encounter for screening for lipoid disorders: Secondary | ICD-10-CM | POA: Diagnosis not present

## 2024-05-05 DIAGNOSIS — F32A Depression, unspecified: Secondary | ICD-10-CM

## 2024-05-05 DIAGNOSIS — Z131 Encounter for screening for diabetes mellitus: Secondary | ICD-10-CM | POA: Diagnosis not present

## 2024-05-05 DIAGNOSIS — F419 Anxiety disorder, unspecified: Secondary | ICD-10-CM

## 2024-05-05 DIAGNOSIS — Z0000 Encounter for general adult medical examination without abnormal findings: Secondary | ICD-10-CM | POA: Insufficient documentation

## 2024-05-05 DIAGNOSIS — Z833 Family history of diabetes mellitus: Secondary | ICD-10-CM

## 2024-05-05 LAB — COMPREHENSIVE METABOLIC PANEL WITH GFR
ALT: 17 U/L (ref 0–53)
AST: 20 U/L (ref 0–37)
Albumin: 4.6 g/dL (ref 3.5–5.2)
Alkaline Phosphatase: 41 U/L (ref 39–117)
BUN: 14 mg/dL (ref 6–23)
CO2: 30 meq/L (ref 19–32)
Calcium: 9.1 mg/dL (ref 8.4–10.5)
Chloride: 104 meq/L (ref 96–112)
Creatinine, Ser: 1.02 mg/dL (ref 0.40–1.50)
GFR: 93.32 mL/min (ref >60.00)
Glucose, Bld: 87 mg/dL (ref 70–99)
Potassium: 4.1 meq/L (ref 3.5–5.1)
Sodium: 140 meq/L (ref 135–145)
Total Bilirubin: 0.5 mg/dL (ref 0.2–1.2)
Total Protein: 6.6 g/dL (ref 6.0–8.3)

## 2024-05-05 LAB — LIPID PANEL
Cholesterol: 158 mg/dL (ref 0–200)
HDL: 59.9 mg/dL (ref >39.00)
LDL Cholesterol: 86 mg/dL (ref 0–99)
NonHDL: 97.95
Total CHOL/HDL Ratio: 3
Triglycerides: 61 mg/dL (ref 0.0–149.0)
VLDL: 12.2 mg/dL (ref 0.0–40.0)

## 2024-05-05 LAB — TSH: TSH: 0.87 u[IU]/mL (ref 0.35–5.50)

## 2024-05-05 LAB — CBC
HCT: 39.7 % (ref 39.0–52.0)
Hemoglobin: 13.2 g/dL (ref 13.0–17.0)
MCHC: 33.2 g/dL (ref 30.0–36.0)
MCV: 90.9 fl (ref 78.0–100.0)
Platelets: 189 K/uL (ref 150.0–400.0)
RBC: 4.37 Mil/uL (ref 4.22–5.81)
RDW: 13.4 % (ref 11.5–15.5)
WBC: 6.9 K/uL (ref 4.0–10.5)

## 2024-05-05 LAB — HEMOGLOBIN A1C: Hgb A1c MFr Bld: 5.9 % (ref 4.6–6.5)

## 2024-05-05 NOTE — Assessment & Plan Note (Signed)
 Immunizations UTD. Declines pneumonia vaccine.  Discussed the importance of a healthy diet and regular exercise in order for weight loss, and to reduce the risk of further co-morbidity.  Exam stable. Labs pending.  Follow up in 1 year for repeat physical.

## 2024-05-05 NOTE — Assessment & Plan Note (Signed)
 Controlled.  Continue Wellbutrin .  Scheduled to see therapist.

## 2024-05-05 NOTE — Patient Instructions (Addendum)
 Stop by the lab prior to leaving today. I will notify you of your results once received.   Prevnar 20 is the pneumonia vaccine whenever you are ready.   It was a pleasure to see you today!

## 2024-05-05 NOTE — Progress Notes (Signed)
 Established Patient Office Visit  Subjective   Patient ID: Mark Bender, male    DOB: August 06, 1986  Age: 38 y.o. MRN: [id number]  Chief Complaint  Patient presents with   Annual Exam    HPI  Mark Bender is a 38 year old male with past medical history of OSA, hep c, anemia, hsv 2, anxiety and depression presents today for complete physical and follow up of chronic conditions.  Immunizations: -Tetanus: Completed in 2020 -Pneumonia: declined.  Diet: Fair diet.  Exercise:  regular exercise. Weight training. 3 days a week 45 minutes.   Eye exam: Completes every other year.  Dental exam: Completes 3-4 times a year.  No family hx of breast cancer, colon cancer, prostate cancer  Heart disease, stroke, DM, HTN- father  Patient Active Problem List   Diagnosis Date Noted   Encounter for screening and preventative care 05/05/2024   Mild obstructive sleep apnea 01/16/2024   Excessive daytime sleepiness 11/21/2023   Anemia 09/10/2023   History of herpes simplex type 2 infection 09/10/2023   Anxiety and depression 09/10/2023   Snoring 09/10/2023   Hep C w/o coma, chronic (HCC) 09/10/2023   Cannabis dependence, uncomplicated (HCC) 10/25/2022   Increased frequency of urination 05/02/2022   Past Medical History:  Diagnosis Date   Anemia    Anxiety and depression    Epididymitis 05/01/2022   Herpes simplex type 2 infection    Past Surgical History:  Procedure Laterality Date   Metacarpal repair Left 2021   No Known Allergies       05/05/2024   12:15 PM 09/10/2023   12:41 PM 09/10/2023   12:08 PM  Depression screen PHQ 2/9  Decreased Interest 0 1 0  Down, Depressed, Hopeless 1 1 0  PHQ - 2 Score 1 2 0  Altered sleeping 1 1   Tired, decreased energy 2 1   Change in appetite 0 0   Feeling bad or failure about yourself  0 0   Trouble concentrating 1 1   Moving slowly or fidgety/restless 0 0   Suicidal thoughts 0 0   PHQ-9 Score 5 5   Difficult doing  work/chores Not difficult at all Not difficult at all        05/05/2024   12:15 PM 09/10/2023   12:41 PM  GAD 7 : Generalized Anxiety Score  Nervous, Anxious, on Edge 1 1  Control/stop worrying 0 0  Worry too much - different things 0 0  Trouble relaxing 0 1  Restless 0 1  Easily annoyed or irritable 1 1  Afraid - awful might happen 0 0  Total GAD 7 Score 2 4  Anxiety Difficulty Not difficult at all Not difficult at all      Review of Systems  Constitutional:  Negative for chills, fever, malaise/fatigue and weight loss.  HENT:  Negative for congestion, ear discharge, ear pain, hearing loss, nosebleeds, sinus pain, sore throat and tinnitus.   Eyes:  Negative for blurred vision, double vision, pain, discharge and redness.  Respiratory:  Negative for cough, shortness of breath, wheezing and stridor.   Cardiovascular:  Negative for chest pain, palpitations and leg swelling.  Gastrointestinal:  Negative for abdominal pain, constipation, diarrhea, heartburn, nausea and vomiting.  Genitourinary:  Negative for dysuria, frequency and urgency.  Musculoskeletal:  Negative for myalgias.  Skin:  Negative for rash.  Neurological:  Negative for dizziness, tingling, seizures, weakness and headaches.  Psychiatric/Behavioral:  Negative for depression, substance abuse and suicidal ideas. The  patient is not nervous/anxious.       Objective:     BP 110/62   Pulse 71   Temp 98.7 F (37.1 C) (Oral)   Ht 6' 1 (1.854 m)   Wt 175 lb (79.4 kg)   SpO2 98%   BMI 23.09 kg/m  BP Readings from Last 3 Encounters:  05/05/24 110/62  11/19/23 116/73  09/10/23 138/84   Wt Readings from Last 3 Encounters:  05/05/24 175 lb (79.4 kg)  11/19/23 175 lb 12.8 oz (79.7 kg)  09/10/23 175 lb (79.4 kg)      Physical Exam Vitals and nursing note reviewed.  Constitutional:      Appearance: Normal appearance.  HENT:     Head: Normocephalic and atraumatic.     Right Ear: Tympanic membrane, ear canal and  external ear normal.     Left Ear: Tympanic membrane, ear canal and external ear normal.     Nose: Nose normal.     Mouth/Throat:     Mouth: Mucous membranes are moist.     Pharynx: Oropharynx is clear.  Eyes:     Conjunctiva/sclera: Conjunctivae normal.     Pupils: Pupils are equal, round, and reactive to light.  Cardiovascular:     Rate and Rhythm: Normal rate and regular rhythm.     Pulses: Normal pulses.     Heart sounds: Normal heart sounds.  Pulmonary:     Effort: Pulmonary effort is normal.     Breath sounds: Normal breath sounds.  Abdominal:     General: Abdomen is flat. Bowel sounds are normal.     Palpations: Abdomen is soft.  Musculoskeletal:        General: Normal range of motion.     Cervical back: Normal range of motion.  Skin:    General: Skin is warm and dry.     Capillary Refill: Capillary refill takes less than 2 seconds.  Neurological:     General: No focal deficit present.     Mental Status: He is alert and oriented to person, place, and time. Mental status is at baseline.  Psychiatric:        Mood and Affect: Mood normal.        Behavior: Behavior normal.        Thought Content: Thought content normal.        Judgment: Judgment normal.      No results found for any visits on 05/05/24.     The ASCVD Risk score (Arnett DK, et al., 2019) failed to calculate for the following reasons:   The 2019 ASCVD risk score is only valid for ages 57 to 54    Assessment & Plan:  Encounter for screening and preventative care Assessment & Plan: Immunizations UTD. Declines pneumonia vaccine.  Discussed the importance of a healthy diet and regular exercise in order for weight loss, and to reduce the risk of further co-morbidity.  Exam stable. Labs pending.  Follow up in 1 year for repeat physical.   Orders: -     CBC -     Comprehensive metabolic panel with GFR -     Lipid panel -     TSH -     Hemoglobin A1c  Family history of diabetes mellitus (DM) -      Hemoglobin A1c  Anxiety and depression Assessment & Plan: Controlled.  Continue Wellbutrin .  Scheduled to see therapist.   Mild obstructive sleep apnea Assessment & Plan: Following with pulmonology.  Waiting to recent CPAP.  Reviewed pulmonology notes.      Return in about 1 year (around 05/05/2025) for physical.    Carrol Aurora, NP

## 2024-05-05 NOTE — Assessment & Plan Note (Signed)
 Following with pulmonology.  Waiting to recent CPAP.  Reviewed pulmonology notes.

## 2024-05-11 ENCOUNTER — Telehealth: Payer: Self-pay

## 2024-05-11 NOTE — Telephone Encounter (Signed)
 Received CMN, awaiting provider's signature.

## 2024-05-18 DIAGNOSIS — F1120 Opioid dependence, uncomplicated: Secondary | ICD-10-CM | POA: Diagnosis not present

## 2024-05-19 DIAGNOSIS — F4323 Adjustment disorder with mixed anxiety and depressed mood: Secondary | ICD-10-CM | POA: Diagnosis not present

## 2024-05-21 ENCOUNTER — Telehealth: Payer: Self-pay | Admitting: General Practice

## 2024-05-21 ENCOUNTER — Encounter: Admitting: Clinical

## 2024-05-21 DIAGNOSIS — G4733 Obstructive sleep apnea (adult) (pediatric): Secondary | ICD-10-CM | POA: Diagnosis not present

## 2024-05-21 NOTE — Progress Notes (Signed)
                Day Deery, LCSWThis encounter was created in error - please disregard. 

## 2024-05-21 NOTE — Telephone Encounter (Signed)
 Copied from CRM [id number]. Topic: General - Running Late >> May 21, 2024 10:24 AM Mark Bender wrote: Pt has a virtual appt today but has a last minute work meeting come up and says he may be a few minutes late joining his visit

## 2024-05-21 NOTE — Telephone Encounter (Signed)
 This was with Darice Seats today

## 2024-05-21 NOTE — Progress Notes (Signed)
   Mark Seats, LCSW

## 2024-05-28 NOTE — Telephone Encounter (Signed)
 Faxed to [phone] and received confirmation

## 2024-05-31 ENCOUNTER — Ambulatory Visit (INDEPENDENT_AMBULATORY_CARE_PROVIDER_SITE_OTHER): Admitting: Clinical

## 2024-05-31 DIAGNOSIS — F419 Anxiety disorder, unspecified: Secondary | ICD-10-CM

## 2024-05-31 DIAGNOSIS — F1121 Opioid dependence, in remission: Secondary | ICD-10-CM | POA: Diagnosis not present

## 2024-05-31 NOTE — Progress Notes (Signed)
   Darice Seats, LCSW

## 2024-05-31 NOTE — Progress Notes (Addendum)
 [facility] Counselor/Therapist Progress Note  Patient ID: Mark Bender, MRN: [id number],    Date: 05/31/2024  Time Spent: 8:34am - 9:14am : 40 minutes  Treatment Type: Individual Therapy  Reported Symptoms: anxiety, fear, irritability  Mental Status Exam: Appearance:  Neat and Well Groomed     Behavior: Appropriate  Motor: Normal  Speech/Language:  Clear and Coherent and Normal Rate  Affect: Appropriate  Mood: anxious  Thought process: normal  Thought content:   WNL  Sensory/Perceptual disturbances:   WNL  Orientation: oriented to person, place, time/date, and situation  Attention: Good  Concentration: Good  Memory: WNL  Fund of knowledge:  Good  Insight:   Good  Judgment:  Good  Impulse Control: Good   Risk Assessment: Danger to Self:  No Patient denied current suicidal ideation  Self-injurious Behavior: No Danger to Others: No Patient denied current homicidal ideation Duty to Warn:no Physical Aggression / Violence:No  Access to Firearms a concern: No  Gang Involvement:No   Subjective: Patient reported no changes since last session. Patient stated, everything's going ok overall, things are a little bit better. Patient stated, I feel like I've gotten a little bit more complacent with doing what I need to do to prepare myself with discontinuing the medication I'm on. Patient reported patient continues to attend NA meetings. Patient reported patient was contacting patient's sponsor daily and is now contacting patient's sponsor every 4-5 days. Patient reported patient continues to decrease suboxone dosage. Patient reported patient is currently taking the lowest dosage of suboxone patient has taken throughout treatment. Patient reported fear and anxiety related to decreasing the suboxone dosage. Patient stated, Im ready to come off this medication. Patient stated, because everything's going good in reference to contributing factors for complacency.   Patient reported patient has made changes in the past when consequences occur. Patient stated, when everything's going good I get complacent in reference to recovery. Patient reported increased irritability. Patient stated, I'm open minded to it but not fully on board with that, I would rather look through other avenues of treating my anxiety in response to a consultation with a psychiatrist. Patient reported patient is open to a referral to provider with expertise in Medication Assisted Treatment.    Interventions: Motivational Interviewing. Clinician conducted session initially via caregility video and then via telephone from clinician's home office due to caregility audio not working. Caregility video was not visible due to patient utilizing phone for audio. Patient provided verbal consent to proceed with telehealth session and is aware of limitations of telephone or video visits. Patient participated in session from patient's vehicle. Reviewed events since last session and assessed for changes. Explored and identified contributing factors to recent decrease in patient's attendance at [facility] meetings and contact with patient's sponsor. Clinician reviewed diagnoses and treatment recommendations. Provided psycho education related to diagnoses and treatment. Discussed clinician's scope of practice and the importance of connecting patient with a provider with expertise in Medication Assisted Treatment. Clinician will initiate a referral to a provider with a provider with expertise in Medication Assisted Treatment.  Collaboration of Care: Other Discussed consent required for referral to clinician with expertise inMedication (suboxone) Assisted Treatment.   Patient/Guardian was advised Release of Information must be obtained prior to any record release in order to collaborate their care with an outside provider. Patient/Guardian was advised if they have not already done so to contact  [employer] [employer]  [facility] to sign all necessary forms in order for us  to release information regarding  their care.   Consent: Patient/Guardian gives verbal consent for treatment and assignment of benefits for services provided during this visit. Patient/Guardian expressed understanding and agreed to proceed.   Diagnosis:Opioid dependence in early, early partial, sustained full, or sustained partial remission (HCC) - Opioid Dependence, severe in early remission, on maintenance therapy   Unspecified Anxiety Disorder  Plan:  Clinician will initiate a referral to a provider with expertise in Medication Assisted Treatment. Goals/target dates will not be established for this patient due to patient being referred to another clinician based on patient's clinical needs.   Darice Seats, LCSW

## 2024-06-21 DIAGNOSIS — G4733 Obstructive sleep apnea (adult) (pediatric): Secondary | ICD-10-CM | POA: Diagnosis not present

## 2024-06-25 ENCOUNTER — Encounter: Payer: Self-pay | Admitting: Nurse Practitioner

## 2024-06-25 ENCOUNTER — Ambulatory Visit: Admitting: Nurse Practitioner

## 2024-06-25 VITALS — BP 120/73 | HR 77 | Ht 73.0 in | Wt 173.2 lb

## 2024-06-25 DIAGNOSIS — G4733 Obstructive sleep apnea (adult) (pediatric): Secondary | ICD-10-CM

## 2024-06-25 NOTE — Assessment & Plan Note (Signed)
 Mild OSA on CPAP. Suboptimal compliance as a result of mask and pressure issues. Adjusted settings today. Plan to trial a new silicone FFM. Advised to retrial nasal mask as well with new settings. Encouraged to continue utilizing nightly and increase usage to goal of 6 hr nightly. Minimal cardiovascular risks associated with mild OSA but high symptom burden. Will reassess at follow up. Safe driving practices reviewed.  Patient Instructions  Increase use CPAP every night, minimum of 4-6 hours a night.  Change equipment as directed. Wash your tubing with warm soap and water daily, hang to dry. Wash humidifier portion weekly. Use bottled, distilled water and change daily Be aware of reduced alertness and do not drive or operate heavy machinery if experiencing this or drowsiness.  Exercise encouraged, as tolerated. Healthy weight management discussed.  Avoid or decrease alcohol consumption and medications that make you more sleepy, if possible. Notify if persistent daytime sleepiness occurs even with consistent use of PAP therapy.  Change CPAP supplies... Every month Mask cushions and/or nasal pillows CPAP machine filters Every 3 months Mask frame (not including the headgear) CPAP tubing Every 6 months Mask headgear Chin strap (if applicable) Humidifier water tub  Increased expiratory support to level 3 and made this full time  Adjusted CPAP settings to 5-10 cmH2O  Follow up in 6 weeks with Katie Corianna Avallone,NP, or sooner, if needed

## 2024-06-25 NOTE — Progress Notes (Signed)
 @Patient  ID: Mark Bender, male    DOB: [birth year], 38 y.o.   MRN: [id number]  Chief Complaint  Patient presents with   Sleep Apnea    Not doing well with mask,  started with the full face mask.  Switched out for nasal mask.  This has not been great.  Went back to the full face.  Some nights it does not bother as much.  Still having 3-4 events per hour and is not rested.  Feels worse now.      Referring provider: Vincente Shivers, NP  HPI: 38 year old male, former smoker followed for OSA on CPAP. Past medical history significant for chronic hepatitis C, anxiety and depression, HSV.  TEST/EVENTS:  12/08/2023 HST: AHI 9, SpO2 low 85%   11/19/2023: Ov with Lulamae Skorupski NP Discussed the use of AI scribe software for clinical note transcription with the patient, who gave verbal consent to proceed. OSWALD POTT is a 38 year old male who presents with sleep disturbances and snoring for a sleep consult. He experiences intense snoring throughout the night, which he has recorded using an app on his watch. Despite his wife being a heavy sleeper and not disturbed by the snoring, he is concerned about the severity after listening to the recordings. He also experiences significant sleep disturbances, including frequent awakenings, restlessness, and persistent fatigue even after eight hours of sleep. He experiences daytime sleepiness, particularly during meetings at work when he is not actively speaking, and has a tendency to doze off. He feels drowsy while driving, especially during his commute to [place] three times a week. He has not had any accidents but acknowledges nodding off occasionally during these drives.  There is a family history of sleep apnea, as his father was diagnosed with the condition and used CPAP therapy. He is of a similar build to him. He has experienced sleep paralysis in the past, but not recently. His caffeine intake includes approximately 20 ounces of coffee in the morning,  usually finished by 9:30 AM, and another cup around 1:30 to 2:00 PM. He denies alcohol consumption. No morning headaches, current sleepwalking, or recent episodes of sleep paralysis.  He goes to bed between 930 and 10 PM.  Falls asleep within 30 minutes.  Wakes 4-6 times a night.  Gets up at around 5 or 6 AM.  Does not operate any heavy machinery in his job field but does have to commute far.  Never had a previous sleep study.  No significant weight change over the last 2 years.  No supplemental oxygen use.  Relatively healthy otherwise. Lives with his wife and 2 sons.  Works as a Database administrator. [employer]   01/15/2024: OV with Nayomi Tabron NP  History of Present Illness   DUVALL COMES is a 38 year old male who presents for follow up after home sleep study, which revealed mild OSA.  He experiences ongoing sleep disturbances characterized by intense snoring and significant daytime sleepiness. Despite adequate sleep duration, he feels persistently tired during the day. He has recorded himself snoring, which corroborates his symptoms. A home sleep study revealed mild obstructive sleep apnea.  He is exploring treatment options for his sleep apnea, including the use of an oral appliance.  06/25/2024: Today - follow up Patient presents today for follow up after starting CPAP. He's been having issues with the mask and has been through multiple. He also feels like it's hard to breathe out when on the CPAP. This has resulted  in tolerance problems. He denies any drowsy driving. Still having daytime fatigue.   05/25/2024-06/23/2024: CPAP 5-15 cmH2O 26/30 days; 43% >4 hr; average use 3 hr 31 min Pressure 95th 9.5 Leaks 95th 11.3 AHI 1.7  No Known Allergies  Immunization History  Administered Date(s) Administered   Influenza, Quadrivalent, Recombinant, Inj, Pf 07/04/2022   Influenza,inj,Quad PF,6+ Mos 08/23/2019, 06/07/2020, 08/24/2021   Influenza,inj,Quad PF,6-35 Mos 08/23/2019, 06/07/2020    Influenza-Unspecified 04/24/2023   MMR 10/26/2018   Moderna Sars-Covid-2 Vaccination 09/30/2019, 10/28/2019, 05/23/2020   Tdap 10/26/2018    Past Medical History:  Diagnosis Date   Anemia    Anxiety and depression    Epididymitis 05/01/2022   Herpes simplex type 2 infection     Tobacco History: Social History   Tobacco Use  Smoking Status Former   Types: Cigarettes  Smokeless Tobacco Current  Tobacco Comments   Nicotine pouch    Ready to quit: Not Answered Counseling given: Not Answered Tobacco comments: Nicotine pouch    Outpatient Medications Prior to Visit  Medication Sig Dispense Refill   buPROPion  (WELLBUTRIN  XL) 300 MG 24 hr tablet Take 1 tablet (300 mg total) by mouth daily. 90 tablet 3   valACYclovir  (VALTREX ) 1000 MG tablet Take 1 tablet (1,000 mg total) by mouth daily. 90 tablet 3   No facility-administered medications prior to visit.     Review of Systems: As above    Physical Exam:  BP 120/73 (BP Location: Right Arm, Patient Position: Sitting, Cuff Size: Normal)   Pulse 77   Ht 6' 1 (1.854 m)   Wt 173 lb 3.2 oz (78.6 kg)   SpO2 99% Comment: RA  BMI 22.85 kg/m   GEN: Pleasant, interactive, well-appearing; in no acute distress HEENT:  Normocephalic and atraumatic. PERRLA. Sclera white. Nasal turbinates pink, moist and patent bilaterally. No rhinorrhea present. Oropharynx pink and moist, without exudate or edema. No lesions, ulcerations, or postnasal drip. Mallampati II NECK:  Supple w/ fair ROM. CV: RRR, no m/r/g PULMONARY:  Unlabored, regular breathing. Clear bilaterally A&P w/o wheezes/rales/rhonchi. No accessory muscle use.  GI: BS present and normoactive. Soft, non-tender to palpation.  Neuro: A/Ox3. No focal deficits noted.   Skin: Warm, no lesions or rashe Psych: Normal affect and behavior. Judgement and thought content appropriate.     Lab Results:  CBC    Component Value Date/Time   WBC 6.9 05/05/2024 1224   RBC 4.37 05/05/2024  1224   HGB 13.2 05/05/2024 1224   HCT 39.7 05/05/2024 1224   PLT 189.0 05/05/2024 1224   MCV 90.9 05/05/2024 1224   MCHC 33.2 05/05/2024 1224   RDW 13.4 05/05/2024 1224    BMET    Component Value Date/Time   NA 140 05/05/2024 1224   K 4.1 05/05/2024 1224   CL 104 05/05/2024 1224   CO2 30 05/05/2024 1224   GLUCOSE 87 05/05/2024 1224   BUN 14 05/05/2024 1224   CREATININE 1.02 05/05/2024 1224   CALCIUM 9.1 05/05/2024 1224    BNP No results found for: BNP   Imaging:  No results found.  Administration History     None           No data to display          No results found for: NITRICOXIDE      Assessment & Plan:   Mild obstructive sleep apnea Mild OSA on CPAP. Suboptimal compliance as a result of mask and pressure issues. Adjusted settings today. Plan to trial a new  silicone FFM. Advised to retrial nasal mask as well with new settings. Encouraged to continue utilizing nightly and increase usage to goal of 6 hr nightly. Minimal cardiovascular risks associated with mild OSA but high symptom burden. Will reassess at follow up. Safe driving practices reviewed.  Patient Instructions  Increase use CPAP every night, minimum of 4-6 hours a night.  Change equipment as directed. Wash your tubing with warm soap and water daily, hang to dry. Wash humidifier portion weekly. Use bottled, distilled water and change daily Be aware of reduced alertness and do not drive or operate heavy machinery if experiencing this or drowsiness.  Exercise encouraged, as tolerated. Healthy weight management discussed.  Avoid or decrease alcohol consumption and medications that make you more sleepy, if possible. Notify if persistent daytime sleepiness occurs even with consistent use of PAP therapy.  Change CPAP supplies... Every month Mask cushions and/or nasal pillows CPAP machine filters Every 3 months Mask frame (not including the headgear) CPAP tubing Every 6 months Mask  headgear Chin strap (if applicable) Humidifier water tub  Increased expiratory support to level 3 and made this full time  Adjusted CPAP settings to 5-10 cmH2O  Follow up in 6 weeks with Izetta Stephon Weathers,NP, or sooner, if needed     Advised if symptoms do not improve or worsen, to please contact office for sooner follow up or seek emergency care.   I spent 35 minutes of dedicated to the care of this patient on the date of this encounter to include pre-visit review of records, face-to-face time with the patient discussing conditions above, post visit ordering of testing, clinical documentation with the electronic health record, making appropriate referrals as documented, and communicating necessary findings to members of the patients care team.  Comer LULLA Rouleau, NP 06/25/2024  Pt aware and understands NP's role.

## 2024-06-25 NOTE — Patient Instructions (Addendum)
 Increase use CPAP every night, minimum of 4-6 hours a night.  Change equipment as directed. Wash your tubing with warm soap and water daily, hang to dry. Wash humidifier portion weekly. Use bottled, distilled water and change daily Be aware of reduced alertness and do not drive or operate heavy machinery if experiencing this or drowsiness.  Exercise encouraged, as tolerated. Healthy weight management discussed.  Avoid or decrease alcohol consumption and medications that make you more sleepy, if possible. Notify if persistent daytime sleepiness occurs even with consistent use of PAP therapy.  Change CPAP supplies... Every month Mask cushions and/or nasal pillows CPAP machine filters Every 3 months Mask frame (not including the headgear) CPAP tubing Every 6 months Mask headgear Chin strap (if applicable) Humidifier water tub  Increased expiratory support to level 3 and made this full time  Adjusted CPAP settings to 5-10 cmH2O  Follow up in 6 weeks with Katie Vinnie Bobst,NP, or sooner, if needed

## 2024-07-08 ENCOUNTER — Telehealth: Payer: Self-pay

## 2024-07-08 DIAGNOSIS — G4733 Obstructive sleep apnea (adult) (pediatric): Secondary | ICD-10-CM

## 2024-07-08 NOTE — Telephone Encounter (Signed)
 Copied from CRM [id number]. Topic: Clinical - Medical Advice >> Jul 08, 2024 12:22 PM Mark Bender wrote: Reason for CRM: pt stated he is not wanting to continue his cpap therapy and would like to Mrs. Cobb. State he needs a letter sent to [facility] stating he is not continuing therapy.  Patient would like to stop cpap therapy please advise

## 2024-07-12 NOTE — Telephone Encounter (Signed)
 Mild OSA. Minimal cardiovascular burden. Fine to stop CPAP and change to positional sleeping (side lying). Safe driving practices encouraged with awareness of drowsiness. Can send order to d/c to [facility].

## 2024-07-12 NOTE — Telephone Encounter (Signed)
 Order placed to stop cpap therapy to [facility]

## 2024-07-15 DIAGNOSIS — F4323 Adjustment disorder with mixed anxiety and depressed mood: Secondary | ICD-10-CM | POA: Diagnosis not present

## 2024-07-16 DIAGNOSIS — F1120 Opioid dependence, uncomplicated: Secondary | ICD-10-CM | POA: Diagnosis not present

## 2024-07-16 DIAGNOSIS — F1123 Opioid dependence with withdrawal: Secondary | ICD-10-CM | POA: Diagnosis not present

## 2024-08-04 DIAGNOSIS — F4323 Adjustment disorder with mixed anxiety and depressed mood: Secondary | ICD-10-CM | POA: Diagnosis not present

## 2024-08-11 ENCOUNTER — Ambulatory Visit: Admitting: Nurse Practitioner

## 2024-08-18 DIAGNOSIS — F4323 Adjustment disorder with mixed anxiety and depressed mood: Secondary | ICD-10-CM | POA: Diagnosis not present

## 2024-09-27 ENCOUNTER — Other Ambulatory Visit (HOSPITAL_COMMUNITY)
Admission: EM | Admit: 2024-09-27 | Discharge: 2024-10-01 | Disposition: A | Discharge status: Home / Self Care | Source: Intra-hospital

## 2024-09-27 ENCOUNTER — Ambulatory Visit (HOSPITAL_COMMUNITY)
Admission: EM | Admit: 2024-09-27 | Discharge: 2024-09-27 | Disposition: A | Discharge status: Other Acute Inpatient Hospital

## 2024-09-27 ENCOUNTER — Encounter (HOSPITAL_COMMUNITY): Payer: Self-pay

## 2024-09-27 DIAGNOSIS — F1320 Sedative, hypnotic or anxiolytic dependence, uncomplicated: Secondary | ICD-10-CM | POA: Insufficient documentation

## 2024-09-27 DIAGNOSIS — F1120 Opioid dependence, uncomplicated: Secondary | ICD-10-CM | POA: Insufficient documentation

## 2024-09-27 DIAGNOSIS — Z658 Other specified problems related to psychosocial circumstances: Secondary | ICD-10-CM | POA: Insufficient documentation

## 2024-09-27 DIAGNOSIS — F411 Generalized anxiety disorder: Secondary | ICD-10-CM | POA: Diagnosis not present

## 2024-09-27 DIAGNOSIS — R9431 Abnormal electrocardiogram [ECG] [EKG]: Secondary | ICD-10-CM

## 2024-09-27 DIAGNOSIS — I4519 Other right bundle-branch block: Secondary | ICD-10-CM | POA: Diagnosis not present

## 2024-09-27 DIAGNOSIS — Z79899 Other long term (current) drug therapy: Secondary | ICD-10-CM | POA: Insufficient documentation

## 2024-09-27 DIAGNOSIS — Z630 Problems in relationship with spouse or partner: Secondary | ICD-10-CM | POA: Insufficient documentation

## 2024-09-27 DIAGNOSIS — I4510 Unspecified right bundle-branch block: Secondary | ICD-10-CM | POA: Diagnosis not present

## 2024-09-27 DIAGNOSIS — F1920 Other psychoactive substance dependence, uncomplicated: Secondary | ICD-10-CM | POA: Diagnosis present

## 2024-09-27 DIAGNOSIS — F32A Depression, unspecified: Secondary | ICD-10-CM | POA: Diagnosis not present

## 2024-09-27 LAB — COMPREHENSIVE METABOLIC PANEL WITH GFR
ALT: 40 U/L (ref 0–44)
AST: 39 U/L (ref 15–41)
Albumin: 4.9 g/dL (ref 3.5–5.0)
Alkaline Phosphatase: 51 U/L (ref 38–126)
Anion gap: 10 (ref 5–15)
BUN: 17 mg/dL (ref 6–20)
CO2: 27 mmol/L (ref 22–32)
Calcium: 9.3 mg/dL (ref 8.9–10.3)
Chloride: 102 mmol/L (ref 98–111)
Creatinine, Ser: 0.94 mg/dL (ref 0.61–1.24)
GFR, Estimated: >60 mL/min
Glucose, Bld: 90 mg/dL (ref 70–99)
Potassium: 4.5 mmol/L (ref 3.5–5.1)
Sodium: 139 mmol/L (ref 135–145)
Total Bilirubin: 0.5 mg/dL (ref 0.0–1.2)
Total Protein: 7.3 g/dL (ref 6.5–8.1)

## 2024-09-27 LAB — POCT URINE DRUG SCREEN - MANUAL ENTRY (I-SCREEN)
POC Amphetamine UR: NOT DETECTED
POC Buprenorphine (BUP): NOT DETECTED
POC Cocaine UR: NOT DETECTED
POC Marijuana UR: NOT DETECTED
POC Methadone UR: NOT DETECTED
POC Methamphetamine UR: NOT DETECTED
POC Morphine: NOT DETECTED
POC Oxazepam (BZO): POSITIVE — AB
POC Oxycodone UR: NOT DETECTED
POC Secobarbital (BAR): NOT DETECTED

## 2024-09-27 LAB — CBC
HCT: 42.7 % (ref 39.0–52.0)
Hemoglobin: 14.3 g/dL (ref 13.0–17.0)
MCH: 30.6 pg (ref 26.0–34.0)
MCHC: 33.5 g/dL (ref 30.0–36.0)
MCV: 91.2 fL (ref 80.0–100.0)
Platelets: 209 K/uL (ref 150–400)
RBC: 4.68 MIL/uL (ref 4.22–5.81)
RDW: 12.3 % (ref 11.5–15.5)
WBC: 8.1 K/uL (ref 4.0–10.5)
nRBC: 0 % (ref 0.0–0.2)

## 2024-09-27 LAB — ETHANOL: Alcohol, Ethyl (B): <15 mg/dL

## 2024-09-27 MED ORDER — HYDROXYZINE HCL 25 MG PO TABS: 25.0000 mg | ORAL_TABLET | Freq: Four times a day (QID) | ORAL | Status: DC | PRN

## 2024-09-27 MED ORDER — MAGNESIUM HYDROXIDE 400 MG/5ML PO SUSP: 30.0000 mL | Freq: Every day | ORAL | Status: DC | PRN

## 2024-09-27 MED ORDER — HALOPERIDOL LACTATE 5 MG/ML IJ SOLN: 5.0000 mg | Freq: Three times a day (TID) | INTRAMUSCULAR | Status: DC | PRN

## 2024-09-27 MED ORDER — DIPHENHYDRAMINE HCL 50 MG/ML IJ SOLN: 50.0000 mg | Freq: Three times a day (TID) | INTRAMUSCULAR | Status: DC | PRN

## 2024-09-27 MED ORDER — LOPERAMIDE HCL 2 MG PO CAPS: 2.0000 mg | ORAL_CAPSULE | ORAL | Status: DC | PRN

## 2024-09-27 MED ORDER — METHOCARBAMOL 500 MG PO TABS
500.0000 mg | ORAL_TABLET | Freq: Three times a day (TID) | ORAL | Status: DC | PRN
  Administered 2024-09-29: 500 mg via ORAL
  Filled 2024-09-27: qty 1

## 2024-09-27 MED ORDER — ACETAMINOPHEN 325 MG PO TABS
650.0000 mg | ORAL_TABLET | Freq: Four times a day (QID) | ORAL | Status: DC | PRN
  Administered 2024-09-28: 650 mg via ORAL
  Filled 2024-09-27: qty 2

## 2024-09-27 MED ORDER — TRAZODONE HCL 50 MG PO TABS
50.0000 mg | ORAL_TABLET | Freq: Every evening | ORAL | Status: DC | PRN
  Administered 2024-09-30: 50 mg via ORAL
  Filled 2024-09-27: qty 1

## 2024-09-27 MED ORDER — ADULT MULTIVITAMIN W/MINERALS CH
1.0000 | ORAL_TABLET | Freq: Every day | ORAL | Status: DC
  Administered 2024-09-27 – 2024-10-01 (×5): 1 via ORAL
  Filled 2024-09-27 (×5): qty 1

## 2024-09-27 MED ORDER — LORAZEPAM 2 MG/ML IJ SOLN: 2.0000 mg | Freq: Three times a day (TID) | INTRAMUSCULAR | Status: DC | PRN

## 2024-09-27 MED ORDER — ONDANSETRON 4 MG PO TBDP: 4.0000 mg | ORAL_TABLET | Freq: Four times a day (QID) | ORAL | Status: DC | PRN

## 2024-09-27 MED ORDER — NICOTINE 21 MG/24HR TD PT24
21.0000 mg | MEDICATED_PATCH | Freq: Every day | TRANSDERMAL | Status: DC
  Administered 2024-09-28 – 2024-10-01 (×4): 21 mg via TRANSDERMAL
  Filled 2024-09-27 (×4): qty 1

## 2024-09-27 MED ORDER — CHLORDIAZEPOXIDE HCL 25 MG PO CAPS
25.0000 mg | ORAL_CAPSULE | Freq: Four times a day (QID) | ORAL | Status: DC | PRN
  Administered 2024-09-27: 25 mg via ORAL
  Filled 2024-09-27: qty 1

## 2024-09-27 MED ORDER — ALUM & MAG HYDROXIDE-SIMETH 200-200-20 MG/5ML PO SUSP: 30.0000 mL | ORAL | Status: DC | PRN

## 2024-09-27 MED ORDER — THIAMINE HCL 100 MG/ML IJ SOLN
100.0000 mg | Freq: Once | INTRAMUSCULAR | Status: AC
  Administered 2024-09-27: 100 mg via INTRAMUSCULAR
  Filled 2024-09-27: qty 2

## 2024-09-27 MED ORDER — NICOTINE 21 MG/24HR TD PT24
21.0000 mg | MEDICATED_PATCH | Freq: Every day | TRANSDERMAL | Status: DC
  Administered 2024-09-27: 21 mg via TRANSDERMAL
  Filled 2024-09-27: qty 1

## 2024-09-27 MED ORDER — NICOTINE POLACRILEX 2 MG MT GUM: 2.0000 mg | CHEWING_GUM | OROMUCOSAL | Status: DC | PRN

## 2024-09-27 MED ORDER — HALOPERIDOL 5 MG PO TABS: 5.0000 mg | ORAL_TABLET | Freq: Three times a day (TID) | ORAL | Status: DC | PRN

## 2024-09-27 MED ORDER — NAPROXEN 500 MG PO TABS
500.0000 mg | ORAL_TABLET | Freq: Two times a day (BID) | ORAL | Status: DC | PRN
  Administered 2024-09-29: 500 mg via ORAL
  Filled 2024-09-27: qty 1

## 2024-09-27 MED ORDER — TRAZODONE HCL 50 MG PO TABS: 50.0000 mg | ORAL_TABLET | Freq: Every evening | ORAL | Status: DC | PRN

## 2024-09-27 MED ORDER — ACETAMINOPHEN 325 MG PO TABS: 650.0000 mg | ORAL_TABLET | Freq: Four times a day (QID) | ORAL | Status: DC | PRN

## 2024-09-27 MED ORDER — DICYCLOMINE HCL 20 MG PO TABS: 20.0000 mg | ORAL_TABLET | Freq: Four times a day (QID) | ORAL | Status: DC | PRN

## 2024-09-27 MED ORDER — HALOPERIDOL LACTATE 5 MG/ML IJ SOLN: 10.0000 mg | Freq: Three times a day (TID) | INTRAMUSCULAR | Status: DC | PRN

## 2024-09-27 MED ORDER — HYDROXYZINE HCL 25 MG PO TABS
25.0000 mg | ORAL_TABLET | Freq: Three times a day (TID) | ORAL | Status: DC | PRN
  Administered 2024-09-27 – 2024-09-30 (×6): 25 mg via ORAL
  Filled 2024-09-27 (×6): qty 1

## 2024-09-27 MED ORDER — DIPHENHYDRAMINE HCL 50 MG PO CAPS: 50.0000 mg | ORAL_CAPSULE | Freq: Three times a day (TID) | ORAL | Status: DC | PRN

## 2024-09-27 NOTE — Group Note (Signed)
 Group Topic: Feelings about Diagnosis  Group Date: 09/27/2024 Start Time: 1330 End Time: 1345 Facilitators: Vincente Asberry CROME, RN  Department: [facility]  Number of Participants: 3  Group Focus: communication Treatment Modality:  Skills Training Interventions utilized were patient education Purpose: improve communication skills  Name: Mark Bender Date of Birth: [birth year]  MR: [id number]    Level of Participation: moderate Quality of Participation: cooperative Interactions with others: gave feedback Mood/Affect: blunted Triggers (if applicable):  Cognition: processing slowly Progress: Minimal Response: Pt is newly admitted to the unit.  Learning about patient and his needs.   Plan: follow-up needed.  Pt encouraged to report any withdrawal symptoms.  Educated on CIWA and COW  Patients Problems:  Patient Active Problem List   Diagnosis Date Noted   Opioid use disorder, moderate, dependence (HCC) 09/27/2024   Polysubstance dependence (HCC) 09/27/2024   Encounter for screening and preventative care 05/05/2024   Mild obstructive sleep apnea 01/16/2024   Excessive daytime sleepiness 11/21/2023   Anemia 09/10/2023   History of herpes simplex type 2 infection 09/10/2023   Anxiety and depression 09/10/2023   Snoring 09/10/2023   Hep C w/o coma, chronic (HCC) 09/10/2023   Cannabis dependence, uncomplicated (HCC) 10/25/2022   Increased frequency of urination 05/02/2022

## 2024-09-27 NOTE — Group Note (Signed)
 Group Topic: Wellness  Group Date: 09/27/2024 Start Time: 1225 End Time: 1255 Facilitators: Leron Charolette CROME, RN  Department: [facility]  Number of Participants: 3  Group Focus: goals/reality orientation and other seven dimensions of wellness  Treatment Modality:  Skills Training and Solution-Focused Therapy Interventions utilized were support Purpose: increase insight  Name: Mark Bender Date of Birth: 01-03-1986  MR: [id number]    Level oLevel of Participation: active Quality of Participation: cooperative Interactions with others: gave feedback Mood/Affect: appropriate Triggers (if applicable): n/a Cognition: insightful Progress: Moderate Response: progressive  Plan: follow-up needed  Patients Problems:  Patient Active Problem List   Diagnosis Date Noted   Encounter for screening and preventative care 05/05/2024   Mild obstructive sleep apnea 01/16/2024   Excessive daytime sleepiness 11/21/2023   Anemia 09/10/2023   History of herpes simplex type 2 infection 09/10/2023   Anxiety and depression 09/10/2023   Snoring 09/10/2023   Hep C w/o coma, chronic (HCC) 09/10/2023   Cannabis dependence, uncomplicated (HCC) 10/25/2022   Increased frequency of urination 05/02/2022

## 2024-09-27 NOTE — ED Notes (Signed)
 Pt sleeping at present, no distress noted.  Monitoring for safety.

## 2024-09-27 NOTE — ED Provider Notes (Signed)
 Facility Based Crisis Admission H&P  Date: 09/27/2024 Patient Name: Mark Bender MRN: [id number] Chief Complaint: detox from opioids/benzos  Diagnoses:  Final diagnoses:  None   HPI: Mark Bender is a 39 year-old-male with a past history of opioid use disorder, anxiety, and depression presenting at [facility] for help with detox. Patient reports that one month ago he relapsed and began using methadone, and two weeks ago also began using diazepam. Reports his most recent use was yesterday and he has been using daily. Prior to relapse, he had been on Suboxone 4 mg daily for the past 6 years, but began tapering off of it a few weeks ago because he wanted to use again. He also reports his had been keeping his Suboxone prescription a secret from his wife, but she found out about the prescription after seeing a paper insert from the prescription around their house. She reportedly gave him an ultimatum to stop the medication. Patient reports his wife also has a history of substance use and is in remission.   Patient reports recent psychosocial stressors including relationship issues with his wife as contributing to his relapse. He reports they work opposite schedules, and don't see each other regularly, which puts a strain on their relationship. They have two children together, ages 19 and 47 years old. Patient reports feelings of mild depression from these stressors as well, but denies SI. He takes Wellbutrin  for depression and anxiety.   He is alert and oriented x 4 on exam. His thought process is linear and coherent. His speech is slow and slurred, and he appears somewhat intoxicated during interview. He denies HI and AVH. He denies current withdrawal symptoms, but does note a past history of opioid withdrawal, including body aches, stomach pain, nausea and diarrhea. He states he feels the drugs are still in his system as he just used yesterday.   PHQ 2-9:  [employer] Visit from 05/05/2024  in [facility] Visit from 09/10/2023 in [facility]  Thoughts that you would be better off dead, or of hurting yourself in some way Not at all Not at all  PHQ-9 Total Score 5 5    Flowsheet Row ED from 09/27/2024 in [facility] Most recent reading at 09/27/2024 12:20 PM ED from 09/27/2024 in [facility] Most recent reading at 09/27/2024 11:08 AM  C-SSRS RISK CATEGORY No Risk No Risk     Musculoskeletal  Strength & Muscle Tone: within normal limits Gait & Station: normal Patient leans: N/A  Psychiatric Specialty Exam   Presentation General Appearance: Casual; Fairly Groomed  Eye Contact:Fair  Speech:Slow; Slurred  Speech Volume:Normal  Handedness:No data recorded  Mood and Affect  Mood:Depressed  Affect:Congruent; Constricted   Thought Process  Thought Processes:Coherent; Goal Directed; Linear  Descriptions of Associations:Intact  Orientation:Full (Time, Place and Person)  Thought Content:WDL  Diagnosis of Schizophrenia or Schizoaffective disorder in past: No   Hallucinations:Hallucinations: None  Ideas of Reference:None  Suicidal Thoughts:Suicidal Thoughts: No  Homicidal Thoughts:Homicidal Thoughts: No   Sensorium  Memory:Immediate Fair; Recent Fair  Judgment:Poor  Insight:Fair   Executive Functions  Concentration:Fair  Attention Span:Fair  Recall:Fair  Fund of Knowledge:Good  Language:Good   Psychomotor Activity  Psychomotor Activity:Psychomotor Activity: Normal   Assets  Assets:Desire for Improvement; Physical Health; Communication Skills   Sleep  Sleep:Sleep: Fair   Nutritional Assessment (For OBS and [facility] admissions only) Has the patient had a  weight loss or gain of 10 pounds or more in the last 3 months?: No Has the patient had a decrease in food intake/or appetite?: No Does the patient have dental  problems?: No Does the patient have eating habits or behaviors that may be indicators of an eating disorder including binging or inducing vomiting?: No Has the patient recently lost weight without trying?: 0 Has the patient been eating poorly because of a decreased appetite?: 0 Malnutrition Screening Tool Score: 0    Physical Exam Vitals and nursing note reviewed.  Constitutional:      General: He is not in acute distress.    Appearance: He is well-developed. He is not ill-appearing.  HENT:     Head: Normocephalic and atraumatic.  Eyes:     Conjunctiva/sclera: Conjunctivae normal.  Cardiovascular:     Rate and Rhythm: Normal rate and regular rhythm.     Heart sounds: No murmur heard. Pulmonary:     Effort: Pulmonary effort is normal. No respiratory distress.     Breath sounds: Normal breath sounds.  Abdominal:     Palpations: Abdomen is soft.     Tenderness: There is no abdominal tenderness.  Musculoskeletal:        General: No swelling.     Cervical back: Neck supple.  Skin:    General: Skin is warm and dry.     Capillary Refill: Capillary refill takes less than 2 seconds.  Neurological:     Mental Status: He is alert.    Review of Systems  Gastrointestinal:  Negative for abdominal pain, constipation, diarrhea, nausea and vomiting.  Neurological:  Negative for dizziness and headaches.  All other systems reviewed and are negative.   Blood pressure 120/70, pulse 66, temperature 98 F (36.7 C), temperature source Oral, resp. rate 20, SpO2 100%. There is no height or weight on file to calculate BMI.  Past Psychiatric History: Opioid use disorder, depression, anxiety   Is the patient at risk to self? No  Has the patient been a risk to self in the past 6 months? No .    Has the patient been a risk to self within the distant past? No   Is the patient a risk to others? No   Has the patient been a risk to others in the past 6 months? No   Has the patient been a risk to  others within the distant past? No   Past Medical History: Mild OSA, Hep C, HSV2 Social History: Patient is married and lives with his wife and two children (ages 26 and 22 y.o.)  Last Labs:  Admission on 09/27/2024, Discharged on 09/27/2024  Component Date Value Ref Range Status   WBC 09/27/2024 8.1  4.0 - 10.5 K/uL Final   RBC 09/27/2024 4.68  4.22 - 5.81 MIL/uL Final   Hemoglobin 09/27/2024 14.3  13.0 - 17.0 g/dL Final   HCT 98/94/7973 42.7  39.0 - 52.0 % Final   MCV 09/27/2024 91.2  80.0 - 100.0 fL Final   MCH 09/27/2024 30.6  26.0 - 34.0 pg Final   MCHC 09/27/2024 33.5  30.0 - 36.0 g/dL Final   RDW 98/94/7973 12.3  11.5 - 15.5 % Final   Platelets 09/27/2024 209  150 - 400 K/uL Final   nRBC 09/27/2024 0.0  0.0 - 0.2 % Final   Performed at [facility], [address], [place], [place] [address]   POC Amphetamine UR 09/27/2024 None Detected  NONE DETECTED (Cut Off Level 1000 ng/mL)  Final   POC Secobarbital (BAR) 09/27/2024 None Detected  NONE DETECTED (Cut Off Level 300 ng/mL) Final   POC Buprenorphine (BUP) 09/27/2024 None Detected  NONE DETECTED (Cut Off Level 10 ng/mL) Final   POC Oxazepam (BZO) 09/27/2024 Positive (A)  NONE DETECTED (Cut Off Level 300 ng/mL) Final   POC Cocaine UR 09/27/2024 None Detected  NONE DETECTED (Cut Off Level 300 ng/mL) Final   POC Methamphetamine UR 09/27/2024 None Detected  NONE DETECTED (Cut Off Level 1000 ng/mL) Final   POC Morphine 09/27/2024 None Detected  NONE DETECTED (Cut Off Level 300 ng/mL) Final   POC Methadone UR 09/27/2024 None Detected  NONE DETECTED (Cut Off Level 300 ng/mL) Final   POC Oxycodone UR 09/27/2024 None Detected  NONE DETECTED (Cut Off Level 100 ng/mL) Final   POC Marijuana UR 09/27/2024 None Detected  NONE DETECTED (Cut Off Level 50 ng/mL) Final  Office Visit on 05/05/2024  Component Date Value Ref Range Status   WBC 05/05/2024 6.9  4.0 - 10.5 K/uL Final   RBC 05/05/2024 4.37  4.22 - 5.81 Mil/uL Final   Platelets  05/05/2024 189.0  150.0 - 400.0 K/uL Final   Hemoglobin 05/05/2024 13.2  13.0 - 17.0 g/dL Final   HCT 91/86/7974 39.7  39.0 - 52.0 % Final   MCV 05/05/2024 90.9  78.0 - 100.0 fl Final   MCHC 05/05/2024 33.2  30.0 - 36.0 g/dL Final   RDW 91/86/7974 13.4  11.5 - 15.5 % Final   Sodium 05/05/2024 140  135 - 145 mEq/L Final   Potassium 05/05/2024 4.1  3.5 - 5.1 mEq/L Final   Chloride 05/05/2024 104  96 - 112 mEq/L Final   CO2 05/05/2024 30  19 - 32 mEq/L Final   Glucose, Bld 05/05/2024 87  70 - 99 mg/dL Final   BUN 91/86/7974 14  6 - 23 mg/dL Final   Creatinine, Ser 05/05/2024 1.02  0.40 - 1.50 mg/dL Final   Total Bilirubin 05/05/2024 0.5  0.2 - 1.2 mg/dL Final   Alkaline Phosphatase 05/05/2024 41  39 - 117 U/L Final   AST 05/05/2024 20  0 - 37 U/L Final   ALT 05/05/2024 17  0 - 53 U/L Final   Total Protein 05/05/2024 6.6  6.0 - 8.3 g/dL Final   Albumin 91/86/7974 4.6  3.5 - 5.2 g/dL Final   GFR 91/86/7974 93.32  >60.00 mL/min Final   Calculated using the CKD-EPI Creatinine Equation (2021)   Calcium 05/05/2024 9.1  8.4 - 10.5 mg/dL Final   Cholesterol 91/86/7974 158  0 - 200 mg/dL Final   ATP III Classification       Desirable:  < 200 mg/dL               Borderline High:  200 - 239 mg/dL          High:  > = 759 mg/dL   Triglycerides 91/86/7974 61.0  0.0 - 149.0 mg/dL Final   Normal:  <849 mg/dLBorderline High:  150 - 199 mg/dL   HDL 91/86/7974 40.09  >39.00 mg/dL Final   VLDL 91/86/7974 12.2  0.0 - 40.0 mg/dL Final   LDL Cholesterol 05/05/2024 86  0 - 99 mg/dL Final   Total CHOL/HDL Ratio 05/05/2024 3   Final                  Men          Women1/2 Average Risk     3.4  3.3Average Risk          5.0          4.42X Average Risk          9.6          7.13X Average Risk          15.0          11.0                       NonHDL 05/05/2024 97.95   Final   NOTE:  Non-HDL goal should be 30 mg/dL higher than patient's LDL goal (i.e. LDL goal of < 70 mg/dL, would have non-HDL goal of < 100  mg/dL)   TSH 91/86/7974 9.12  0.35 - 5.50 uIU/mL Final   Hgb A1c MFr Bld 05/05/2024 5.9  4.6 - 6.5 % Final   Glycemic Control Guidelines for People with Diabetes:Non Diabetic:  <6%Goal of Therapy: <7%Additional Action Suggested:  >8%     Allergies: Patient has no known allergies.  Medications:  PTA Medications  Medication Sig   valACYclovir  (VALTREX ) 1000 MG tablet Take 1 tablet (1,000 mg total) by mouth daily.   buPROPion  (WELLBUTRIN  XL) 300 MG 24 hr tablet Take 1 tablet (300 mg total) by mouth daily.   Buprenorphine HCl-Naloxone HCl 4-1 MG FILM Place 1 Film under the tongue daily.    Long Term Goals: Improvement in symptoms so as ready for discharge  Short Term Goals: Patient will verbalize feelings in meetings with treatment team members., Patient will attend at least of 50% of the groups daily., Pt will complete the PHQ9 on admission, day 3 and discharge., Patient will participate in completing the [place] Suicide Severity Rating Scale, Patient will score a low risk of violence for 24 hours prior to discharge, and Patient will take medications as prescribed daily.  Medical Decision Making  39 year old male with a history of opioid use disorder presenting for detox. Patient admitted to [facility]. Unclear if patient would like to resume Suboxone or other form of MAT. Vital signs stable. No signs of acute withdrawal; continue to monitor. COWS and CIWA protocol in place.   Recommendations  Based on my evaluation the patient does not appear to have an emergency medical condition.  Ashley LOISE Gravely, MD 09/27/2024  1:08 PM

## 2024-09-27 NOTE — Care Management (Signed)
 [facility] Care Management.Public Affairs Consultant met with patient to discuss discharge planning  Writer asked if patient is seeking inpatient or out patient services  Patient reported that he wanted to speak with sponsor first   Writer advised patient that this is 3-5 day stay and to let writer know something after speaking to sponsor  Patient was dosing off during conversation

## 2024-09-27 NOTE — BH Assessment (Signed)
 Comprehensive Clinical Assessment (CCA) Note  09/27/2024 Mark Bender [id number]  DISPOSITION: Per Ashley Gravely MD pt is recommended for admission to [facility] for detox   The patient demonstrates the following risk factors for suicide: Chronic risk factors for suicide include: substance use disorder. Acute risk factors for suicide include: substance use. Protective factors for this patient include: positive social support, responsibility to others (children, family), and hope for the future. Considering these factors, the overall suicide risk at this point appears to be low. Patient is appropriate for outpatient follow up.   Per Triage assessment: Mark Bender is a 39 year old male presenting to [facility] accompanied by his friend. Pt states he is looking for detox, after being referred from [place] at [facility]. Pt states he is looking to detox off of Benzos and Opioids. Pt states that he has been struggling with his addiction for 6 years and has been ongoing. Pt states he last used yesterday, 16 mg diazepam and 80 mg of methadone. Pt states he is taking these drugs daily. Pt denies alcohol use, Si, Hi and AVH. Pt also denies having a therapist at this time. No known mental health diagnoses to report at this time.  With further assessment: Pt is a 39 yo male who presented voluntarily accompanied by a friend requesting detox from opioids and benzodiazepines. Pt stated that he uses methadone and diazepam daily. Pt also reported that he drinks alcohol about twice a week (2 beers). Pt stated that he was referred to [facility] by [facility] for detox.   Pt stated that he has been married fro 7 years and has 2 children (ages 1 & 40 yo). 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 Pt stated that he has no mental health hx but did report that he has been prescribed anti-depressants by his PCP, [facility] in [place]. Pt denied SI, HI,  NSSH, AVH, paranoia or any past psychiatric IP admission. Pt stated that he sleeps between 5-7 hours each night and has a normal appetite.    Chief Complaint:  Chief Complaint  Patient presents with   Addiction Problem   Visit Diagnosis:  Opiate use d/o Sedative, Hypnotic or Anxiolytic Use d/o   CCA Screening, Triage and Referral (STR)  Patient Reported Information How did you hear about us ? Family/Friend  What Is the Reason for Your Visit/Call Today? Mark Bender is a 39 year old male presenting to [facility] accompanied by his friend. Pt states he is looking for detox, after being referred from [place] at [facility]. Pt states he is looking to detox off of Benzos and Opioids. Pt states that he has been struggling with his addiction for 6 years and has been ongoing. Pt states he last used yesterday, 16 mg diazepam and 80 mg of methadone. Pt states he is taking these drugs daily. Pt denies alcohol use, Si, Hi and AVH. Pt also denies having a therapist at this time. No known mental health diagnoses to report at this time.  How Long Has This Been Causing You Problems? No data recorded What Do You Feel Would Help You the Most Today? Medication(s); Alcohol or Drug Use Treatment   Have You Recently Had Any Thoughts About Hurting Yourself? No  Are You Planning to Commit Suicide/Harm Yourself At This time? No   Flowsheet Row ED from 09/27/2024 in [facility]  C-SSRS RISK CATEGORY No Risk    Have  you Recently Had Thoughts About Hurting Someone Sherral? No  Are You Planning to Harm Someone at This Time? No  Explanation: na  Have You Used Any Alcohol or Drugs in the Past 24 Hours? Yes  How Long Ago Did You Use Drugs or Alcohol? yesterday What Did You Use and How Much? 16 mg diazepam; 80 mg methodone  Do You Currently Have a Therapist/Psychiatrist? No  Name of Therapist/Psychiatrist:    Have You Been Recently Discharged From Any Office Practice or Programs?  No  Explanation of Discharge From Practice/Program: na    CCA Screening Triage Referral Assessment Type of Contact: Face-to-Face  Telemedicine Service Delivery:   Is this Initial or Reassessment?   Date Telepsych consult ordered in [facility]:    Time Telepsych consult ordered in [facility]:    Location of Assessment: [facility] Assessment Services  Provider Location: [facility] Assessment Services   Collateral Involvement: none   Does Patient Have a Automotive Engineer Guardian? No  Legal Guardian Contact Information: na  Copy of Legal Guardianship Form: -- (na)  Legal Guardian Notified of Arrival: -- (na)  Legal Guardian Notified of Pending Discharge: -- (na)  If Minor and Not Living with Parent(s), Who has Custody? adult  Is CPS involved or ever been involved? Never  Is APS involved or ever been involved? Never   Patient Determined To Be At Risk for Harm To Self or Others Based on Review of Patient Reported Information or Presenting Complaint? No  Method: No Plan  Availability of Means: No access or NA  Intent: Vague intent or NA  Notification Required: No need or identified person  Additional Information for Danger to Others Potential: -- (na)  Additional Comments for Danger to Others Potential: none  Are There Guns or Other Weapons in Your Home? No  Types of Guns/Weapons: na  Are These Weapons Safely Secured?                            -- (na)  Who Could Verify You Are Able To Have These Secured: na  Do You Have any Outstanding Charges, Pending Court Dates, Parole/Probation? pt denied  Contacted To Inform of Risk of Harm To Self or Others: -- (na)    Does Patient Present under Involuntary Commitment? No    [place] of Residence: [place]   Patient Currently Receiving the Following Services: Not Receiving Services   Determination of Need: Urgent (48 hours) (Per Ashley Gravely MD pt is recommended for admission to [facility] for detox)   Options For  Referral: Facility-Based Crisis     CCA Biopsychosocial Patient Reported Schizophrenia/Schizoaffective Diagnosis in Past: No   Strengths: able to ask for help   Mental Health Symptoms Depression:  None   Duration of Depressive symptoms:    Mania:  None   Anxiety:   None   Psychosis:  None   Duration of Psychotic symptoms:    Trauma:  None   Obsessions:  None   Compulsions:  No data recorded  Inattention:  N/A   Hyperactivity/Impulsivity:  N/A   Oppositional/Defiant Behaviors:  N/A   Emotional Irregularity:  None   Other Mood/Personality Symptoms:  none    Mental Status Exam Appearance and self-care  Stature:  Average   Weight:  Average weight   Clothing:  Casual; Neat/clean   Grooming:  Normal   Cosmetic use:  None   Posture/gait:  Normal   Motor activity:  Not  Remarkable   Sensorium  Attention:  Normal (seemed a bit sleepy, groggy)   Concentration:  Normal   Orientation:  X5   Recall/memory:  Normal   Affect and Mood  Affect:  Flat   Mood:  Euthymic   Relating  Eye contact:  Fleeting (kept eyes closed much of the time)   Facial expression:  Constricted   Attitude toward examiner:  Cooperative   Thought and Language  Speech flow: Clear and Coherent   Thought content:  Appropriate to Mood and Circumstances   Preoccupation:  None   Hallucinations:  None   Organization:  Coherent   [employer] of Knowledge:  Average   Intelligence:  Average   Abstraction:  Functional   Judgement:  Fair   Dance Movement Psychotherapist:  Adequate   Insight:  Fair; Gaps   Decision Making:  Normal   Social Functioning  Social Maturity:  Responsible   Social Judgement:  Normal   Stress  Stressors:  -- (none reported)   Coping Ability:  Normal   Skill Deficits:  None   Supports:  Family; Support needed     Religion: Religion/Spirituality Are You A Religious Person?: Yes What is Your Religious Affiliation?: Unknown How  Might This Affect Treatment?: unknown  Leisure/Recreation: Leisure / Recreation Do You Have Hobbies?: Yes Leisure and Hobbies: mountain biking and soccer  Exercise/Diet: Exercise/Diet Do You Exercise?: Yes What Type of Exercise Do You Do?: Bike How Many Times a Week Do You Exercise?: 1-3 times a week Have You Gained or Lost A Significant Amount of Weight in the Past Six Months?: No Do You Follow a Special Diet?: No Do You Have Any Trouble Sleeping?: No   CCA Employment/Education Employment/Work Situation: Employment / Work Situation Employment Situation: Employed Work Stressors: none reported Patient's Job has Been Impacted by Current Illness: Yes Describe how Patient's Job has Been Impacted: Pt stated he had to leave work today mid-day Has Patient ever Been in Equities Trader?: No  Education: Education Is Patient Currently Attending School?: No Last Grade Completed: 16 Did You Product Manager?: Yes What Type of College Degree Do you Have?: BS in Bio-Engineering Did You Have An Individualized Education Program (IIEP): No Did You Have Any Difficulty At School?: No Patient's Education Has Been Impacted by Current Illness: No   CCA Family/Childhood History Family and Relationship History: Family history Marital status: Married Number of Years Married: 7 What types of issues is patient dealing with in the relationship?: none reported Additional relationship information: na Does patient have children?: Yes How many children?: 2 (Ages 1 & 4) How is patient's relationship with their children?: good  Childhood History:  Childhood History By whom was/is the patient raised?: Both parents Did patient suffer any verbal/emotional/physical/sexual abuse as a child?: No Did patient suffer from severe childhood neglect?: No Has patient ever been sexually abused/assaulted/raped as an adolescent or adult?: No Was the patient ever a victim of a crime or a disaster?: No Witnessed  domestic violence?: No Has patient been affected by domestic violence as an adult?: No       CCA Substance Use Alcohol/Drug Use: Alcohol / Drug Use Pain Medications: see MAR Prescriptions: see MAR Over the Counter: see MAR History of alcohol / drug use?: Yes Longest period of sobriety (when/how long): unknown Negative Consequences of Use:  (none reported) Withdrawal Symptoms:  (none reported) Substance #1 Name of Substance 1: opioids - methodone 1 - Age of First Use: unknown 1 - Amount (size/oz): 80  mg 1 - Frequency: daily 1 - Duration: ongoing 1 - Last Use / Amount: yesterday 1 - Method of Aquiring: clinic 1- Route of Use: oral Substance #2 Name of Substance 2: Diazapam 2 - Age of First Use: unknown 2 - Amount (size/oz): 16 mg 2 - Frequency: daily 2 - Duration: ongoing 2 - Last Use / Amount: yesterday 2 - Method of Aquiring: unknown 2 - Route of Substance Use: oral Substance #3 Name of Substance 3: alcohol 3 - Age of First Use: teen 3 - Amount (size/oz): 2 beers usually 3 - Frequency: 2 x week 3 - Duration: ongoing 3 - Last Use / Amount: 4 days ago 3 - Method of Aquiring: purchase 3 - Route of Substance Use: oral                   ASAM's:  Six Dimensions of Multidimensional Assessment  Dimension 1:  Acute Intoxication and/or Withdrawal Potential:   Dimension 1:  Description of individual's past and current experiences of substance use and withdrawal: none reported  Dimension 2:  Biomedical Conditions and Complications:   Dimension 2:  Description of patient's biomedical conditions and  complications: none reported  Dimension 3:  Emotional, Behavioral, or Cognitive Conditions and Complications:  Dimension 3:  Description of emotional, behavioral, or cognitive conditions and complications: none reported  Dimension 4:  Readiness to Change:     Dimension 5:  Relapse, Continued use, or Continued Problem Potential:     Dimension 6:  Recovery/Living  Environment:     ASAM Severity Score: ASAM's Severity Rating Score: 6  ASAM Recommended Level of Treatment:     Substance use Disorder (SUD) Substance Use Disorder (SUD)  Checklist Symptoms of Substance Use: Continued use despite having a persistent/recurrent physical/psychological problem caused/exacerbated by use, Continued use despite persistent or recurrent social, interpersonal problems, caused or exacerbated by use, Persistent desire or unsuccessful efforts to cut down or control use, Recurrent use that results in a failure to fulfill major role obligations (work, school, home)  Recommendations for Services/Supports/Treatments: Recommendations for Services/Supports/Treatments Recommendations For Services/Supports/Treatments: Detox, Insurance Claims Handler  Disposition Recommendation per psychiatric provider: We recommend transfer to [facility]. Per Ashley Gravely MD pt is recommended for admission to [facility] for detox    DSM5 Diagnoses: Patient Active Problem List   Diagnosis Date Noted   Encounter for screening and preventative care 05/05/2024   Mild obstructive sleep apnea 01/16/2024   Excessive daytime sleepiness 11/21/2023   Anemia 09/10/2023   History of herpes simplex type 2 infection 09/10/2023   Anxiety and depression 09/10/2023   Snoring 09/10/2023   Hep C w/o coma, chronic (HCC) 09/10/2023   Cannabis dependence, uncomplicated (HCC) 10/25/2022   Increased frequency of urination 05/02/2022     Referrals to Alternative Service(s): Referred to Alternative Service(s):   Place:   Date:   Time:    Referred to Alternative Service(s):   Place:   Date:   Time:    Referred to Alternative Service(s):   Place:   Date:   Time:    Referred to Alternative Service(s):   Place:   Date:   Time:     [place] Weekly T, Counselor

## 2024-09-27 NOTE — ED Notes (Signed)
 CIWA and COW scores both =1 due to mild anxiety.  Pt educated on PRN medications available for symptom control if and when he experiences any withdrawal symptoms.  Declined anything for anxiety at this time.  Pt returned to his room to rest.

## 2024-09-27 NOTE — ED Notes (Signed)
 Pt A&O x 4, no distress noted, calm & cooperative, Pt in Group Meeting at present.  Monitoring for safety.

## 2024-09-27 NOTE — Progress Notes (Signed)
" °   09/27/24 1050  [facility] Triage Screening (Walk-ins at [facility] only)  How Did You Hear About Us ? Family/Friend  What Is the Reason for Your Visit/Call Today? Mark Bender is a 39 year old male presenting to [facility] accompanied by his friend. Pt states he is looking for detox, after being referred from [place] at [facility]. Pt states he is looking to detox off of Benzos and Opioids. Pt states that he has been struggling with his addiction for 6 years and has been ongoing. Pt states he last used yesterday, 16 mg diazepam and 80 mg of methadone. Pt states he is taking these drugs daily. Pt denies alcohol use, Si, Hi and AVH. Pt also denies having a therapist at this time. No known mental health diagnoses to report at this time.  Have You Recently Had Any Thoughts About Hurting Yourself? No  Are You Planning to Commit Suicide/Harm Yourself At This time? No  Have you Recently Had Thoughts About Hurting Someone Sherral? No  Are You Planning To Harm Someone At This Time? No  Physical Abuse Denies  Verbal Abuse Denies  Sexual Abuse Denies  Exploitation of patient/patient's resources Denies  Self-Neglect Denies  Possible abuse reported to: Other (Comment)  Are you currently experiencing any auditory, visual or other hallucinations? No  Have You Used Any Alcohol or Drugs in the Past 24 Hours? Yes  Do you have any current medical co-morbidities that require immediate attention? No  What Do You Feel Would Help You the Most Today? Medication(s);Alcohol or Drug Use Treatment  If access to [facility] was not available, would you have sought care in the Emergency Department? No  Determination of Need Urgent (48 hours)  Options For Referral Medication Management;Facility-Based Crisis  Determination of Need filed? Yes    "

## 2024-09-28 DIAGNOSIS — F1320 Sedative, hypnotic or anxiolytic dependence, uncomplicated: Secondary | ICD-10-CM | POA: Diagnosis not present

## 2024-09-28 DIAGNOSIS — F1120 Opioid dependence, uncomplicated: Secondary | ICD-10-CM | POA: Diagnosis not present

## 2024-09-28 DIAGNOSIS — F32A Depression, unspecified: Secondary | ICD-10-CM | POA: Diagnosis not present

## 2024-09-28 DIAGNOSIS — F411 Generalized anxiety disorder: Secondary | ICD-10-CM | POA: Diagnosis not present

## 2024-09-28 MED ORDER — LOPERAMIDE HCL 2 MG PO CAPS: 2.0000 mg | ORAL_CAPSULE | ORAL | Status: DC | PRN

## 2024-09-28 MED ORDER — CHLORDIAZEPOXIDE HCL 25 MG PO CAPS
25.0000 mg | ORAL_CAPSULE | Freq: Four times a day (QID) | ORAL | Status: AC
  Administered 2024-09-28 (×3): 25 mg via ORAL
  Filled 2024-09-28 (×3): qty 1

## 2024-09-28 MED ORDER — CHLORDIAZEPOXIDE HCL 25 MG PO CAPS
25.0000 mg | ORAL_CAPSULE | ORAL | Status: AC
  Administered 2024-09-30 (×2): 25 mg via ORAL
  Filled 2024-09-28 (×2): qty 1

## 2024-09-28 MED ORDER — THIAMINE HCL 100 MG/ML IJ SOLN
100.0000 mg | Freq: Once | INTRAMUSCULAR | Status: AC
  Administered 2024-09-28: 100 mg via INTRAMUSCULAR
  Filled 2024-09-28: qty 2

## 2024-09-28 MED ORDER — CHLORDIAZEPOXIDE HCL 25 MG PO CAPS
25.0000 mg | ORAL_CAPSULE | Freq: Three times a day (TID) | ORAL | Status: AC
  Administered 2024-09-29 (×3): 25 mg via ORAL
  Filled 2024-09-28 (×3): qty 1

## 2024-09-28 MED ORDER — CHLORDIAZEPOXIDE HCL 25 MG PO CAPS
25.0000 mg | ORAL_CAPSULE | Freq: Every day | ORAL | Status: AC
  Administered 2024-10-01: 25 mg via ORAL
  Filled 2024-09-28: qty 1

## 2024-09-28 NOTE — Care Plan (Signed)
 "  Interdisciplinary Treatment and Diagnostic Plan Update  09/28/2024 Time of Session: 2pm Mark Bender MRN: [id number]  Diagnosis:  Final diagnoses:  Opioid type dependence, continuous abuse (HCC)  Benzodiazepine dependence (HCC)     Current Medications:  Current Facility-Administered Medications  Medication Dose Route Frequency Provider Last Rate Last Admin   acetaminophen  (TYLENOL ) tablet 650 mg  650 mg Oral Q6H PRN Tex Drilling, NP   650 mg at 09/28/24 0943   alum & mag hydroxide-simeth (MAALOX/MYLANTA) 200-200-20 MG/5ML suspension 30 mL  30 mL Oral Q4H PRN Tex Drilling, NP       chlordiazePOXIDE  (LIBRIUM ) capsule 25 mg  25 mg Oral QID Nkwenti, Doris, NP   25 mg at 09/28/24 1240   Followed by   NOREEN ON 09/29/2024] chlordiazePOXIDE  (LIBRIUM ) capsule 25 mg  25 mg Oral TID Tex Drilling, NP       Followed by   NOREEN ON 09/30/2024] chlordiazePOXIDE  (LIBRIUM ) capsule 25 mg  25 mg Oral BH-qamhs Nkwenti, Doris, NP       Followed by   NOREEN ON 10/01/2024] chlordiazePOXIDE  (LIBRIUM ) capsule 25 mg  25 mg Oral Daily Nkwenti, Drilling, NP       dicyclomine  (BENTYL ) tablet 20 mg  20 mg Oral Q6H PRN Mannie Ashley SAILOR, MD       diphenhydrAMINE  (BENADRYL ) capsule 50 mg  50 mg Oral TID PRN Tex Drilling, NP       haloperidol  lactate (HALDOL ) injection 5 mg  5 mg Intramuscular TID PRN Tex Drilling, NP       And   diphenhydrAMINE  (BENADRYL ) injection 50 mg  50 mg Intramuscular TID PRN Tex Drilling, NP       And   LORazepam  (ATIVAN ) injection 2 mg  2 mg Intramuscular TID PRN Tex Drilling, NP       diphenhydrAMINE  (BENADRYL ) injection 50 mg  50 mg Intramuscular TID PRN Tex Drilling, NP       And   LORazepam  (ATIVAN ) injection 2 mg  2 mg Intramuscular TID PRN Tex Drilling, NP       hydrOXYzine  (ATARAX ) tablet 25 mg  25 mg Oral TID PRN Tex Drilling, NP   25 mg at 09/28/24 0940   loperamide  (IMODIUM ) capsule 2-4 mg  2-4 mg Oral PRN Mannie Ashley SAILOR, MD       magnesium  hydroxide  (MILK OF MAGNESIA) suspension 30 mL  30 mL Oral Daily PRN Lynnette Barter, MD       methocarbamol  (ROBAXIN ) tablet 500 mg  500 mg Oral Q8H PRN Stevens, Briana N, MD       multivitamin with minerals tablet 1 tablet  1 tablet Oral Daily Mannie Ashley SAILOR, MD   1 tablet at 09/28/24 0940   naproxen  (NAPROSYN ) tablet 500 mg  500 mg Oral BID PRN Stevens, Briana N, MD       nicotine  (NICODERM CQ  - dosed in mg/24 hours) patch 21 mg  21 mg Transdermal Daily Lawrnce, Rhoda J, MD   21 mg at 09/28/24 0940   nicotine  polacrilex (NICORETTE ) gum 2 mg  2 mg Oral PRN Gottfried, Rhoda J, MD       ondansetron  (ZOFRAN -ODT) disintegrating tablet 4 mg  4 mg Oral Q6H PRN Mannie Ashley SAILOR, MD       traZODone  (DESYREL ) tablet 50 mg  50 mg Oral QHS PRN Lynnette Barter, MD       Current Outpatient Medications  Medication Sig Dispense Refill   Buprenorphine HCl-Naloxone HCl 4-1 MG FILM Place 1 Film  under the tongue daily.     buPROPion  (WELLBUTRIN  XL) 300 MG 24 hr tablet Take 1 tablet (300 mg total) by mouth daily. 90 tablet 3   valACYclovir  (VALTREX ) 1000 MG tablet Take 1 tablet (1,000 mg total) by mouth daily. 90 tablet 3   PTA Medications: Prior to Admission medications  Medication Sig Start Date End Date Taking? Authorizing Provider  Buprenorphine HCl-Naloxone HCl 4-1 MG FILM Place 1 Film under the tongue daily. 06/16/24  Yes [provider]  buPROPion  (WELLBUTRIN  XL) 300 MG 24 hr tablet Take 1 tablet (300 mg total) by mouth daily. 09/10/23  Yes Vincente Shivers, NP  valACYclovir  (VALTREX ) 1000 MG tablet Take 1 tablet (1,000 mg total) by mouth daily. 09/10/23  Yes Vincente Shivers, NP    Patient Stressors: Substance abuse    Patient Strengths: Ability for insight  Capable of independent living  Motivation for treatment/growth   Treatment Modalities: Medication Management, Group therapy, Case management,  1 to 1 session with clinician, Psychoeducation, Recreational therapy.   Physician Treatment Plan for Primary  and Secondary Diagnosis:  Final diagnoses:  Opioid type dependence, continuous abuse (HCC)  Benzodiazepine dependence (HCC)   Long Term Goal(s): Improvement in symptoms so as ready for discharge  Short Term Goals: Ability to identify changes in lifestyle to reduce recurrence of condition will improve Ability to verbalize feelings will improve Ability to disclose and discuss suicidal ideas Ability to demonstrate self-control will improve Ability to identify and develop effective coping behaviors will improve Ability to maintain clinical measurements within normal limits will improve Compliance with prescribed medications will improve Ability to identify triggers associated with substance abuse/mental health issues will improve  Medication Management: Evaluate patient's response, side effects, and tolerance of medication regimen.  Therapeutic Interventions: 1 to 1 sessions, Unit Group sessions and Medication administration.  Evaluation of Outcomes: Progressing  LCSW Treatment Plan for Primary Diagnosis:  Final diagnoses:  Opioid type dependence, continuous abuse (HCC)  Benzodiazepine dependence (HCC)    Long Term Goal(s): Safe transition to appropriate next level of care at discharge.  Short Term Goals: Facilitate acceptance of mental health diagnosis and concerns through verbal commitment to aftercare plan and appointments at discharge.  Therapeutic Interventions: Assess for all discharge needs, 1 to 1 time with Child psychotherapist, Explore available resources and support systems, Assess for adequacy in community support network, Educate family and significant other(s) on suicide prevention, Complete Psychosocial Assessment, Interpersonal group therapy.  Evaluation of Outcomes: Progressing   Progress in Treatment: Attending groups: Yes. Participating in groups: Yes. Taking medication as prescribed: Yes. Toleration medication: Yes.  Client is currently on a Librium   taper. Family/Significant other contact made: Yes, individual(s) contacted:  Family/Friend Patient understands diagnosis: Yes. Discussing patient identified problems/goals with staff: Yes. Medical problems stabilized or resolved: Yes. Denies suicidal/homicidal ideation: Yes. Issues/concerns per patient self-inventory: No. Other: Client reports no concerns other than verifying time for work for [employer] to decide if he can enter into residential treatment.   New problem(s) identified: No, Describe:  NA  New Short Term/Long Term Goal(s):  Client reports his goal is to complete his detox treatment & take medications as prescribed at [facility].    Patient Goals:  Client goal after he leaves [facility] is to go directly to a 30 day inpatient treatment center for substance abuse and mental health.  Discharge Plan or Barriers: Not granted FMLA for his job.  Client reports if he's not granted FMLA he will consider outpatient therapy.   Reason for Continuation  of Hospitalization: Anxiety Depression Medication stabilization Withdrawal symptoms  Estimated Length of Stay:09/27/24 - 10/02/24  Last 3 [place] Suicide Severity Risk Score: Flowsheet Row ED from 09/27/2024 in [facility] Most recent reading at 09/28/2024  1:51 PM ED from 09/27/2024 in [facility] Most recent reading at 09/27/2024 11:08 AM  C-SSRS RISK CATEGORY Error: Question 6 not populated No Risk    Last PHQ 2/9 Scores:    09/28/2024   11:40 AM 05/05/2024   12:15 PM 09/10/2023   12:41 PM  Depression screen PHQ 2/9  Decreased Interest 1 0 1  Down, Depressed, Hopeless 1 1 1   PHQ - 2 Score 2 1 2   Altered sleeping 1 1 1   Tired, decreased energy 1 2 1   Change in appetite 1 0 0  Feeling bad or failure about yourself  1 0 0  Trouble concentrating 1 1 1   Moving slowly or fidgety/restless 1 0 0  Suicidal thoughts 1 0 0  PHQ-9 Score 9 5  5    Difficult doing work/chores Somewhat difficult Not  difficult at all Not difficult at all     Data saved with a previous flowsheet row definition    Scribe for Treatment Team: Keyera Hattabaugh, LCSW 09/28/2024 4:38 PM "

## 2024-09-28 NOTE — ED Provider Notes (Signed)
 Behavioral Health Progress Note  Date and Time: 09/28/2024 11:43 AM Name: Mark Bender MRN:  [id number]  HPI: Per initial assessment:  Mark Bender is a 39 year-old-male with a past history of opioid use disorder, anxiety, and depression presenting at [facility] for help with detox. Patient reports that one month ago he relapsed and began using methadone, and two weeks ago also began using diazepam. Reports his most recent use was yesterday and he has been using daily. Prior to relapse, he had been on Suboxone 4 mg daily for the past 6 years, but began tapering off of it a few weeks ago because he wanted to use again. He also reports his had been keeping his Suboxone prescription a secret from his wife, but she found out about the prescription after seeing a paper insert from the prescription around their house. She reportedly gave him an ultimatum to stop the medication. Patient reports his wife also has a history of substance use and is in remission. Raenell, Ashley SAILOR, MD, Date of Service: 09/27/2024  1:04 PM).   24 hr chart review: Sleep Hours last night: Pt reports that sleep last night was fair  Nursing Concerns: none reported/noted  Behavioral episodes in the past 24 hrs: Medication Compliance: Compliant  Vital Signs in the past 24 hrs: WNL PRN Medications in the past 24 hrs: none   Patient assessment, 09/29/2023:  During today's encounter, pt complaints of generalized body pain & malaise, rates discomfort level as 6, 10 being worst. Rates anxiety as 8, 10 being worst. Reports sensation of chills all over his body, reports sleep last night as being interrupted a few times due to night sweats. Shares that energy level is low, he has trouble focusing, reports appetite as being fair. He denies SI, denies HI, denies AVH, denies paranoia, denies delusional thinking, and there are no overt signs of psychosis.   Pt shares that prior to the presentation to this location for detox, he was  using Seven O H ( 7-hydroxymitragynine), which he states is more potent than Kratom, shares that he had been using this substance for at least a few months prior to presenting to this location. Also shares that he had been using Diazepam, consisting of 30-40 mg daily for the past few months; States that his substance addiction all started when he was 15 yrs all, and at 39 yrs old, he went into recovery, got sober until 5 yrs ago when he relapsed, began using Suboxone vis-a-vis the other substances listed above.    We talked about getting him started on a Librium  taper so that he can be a little more comfortable since he is having a lot of physiological effects from the withdrawals related to both Benzos and Opioids today. Pt agreeable to a Librium  taper, and we talked about  a Clonidine taper as well, but his BP and HR does not currently support it, and it was been ordered as needed. This is being ordered due to his use of  Seven O H- a potent, opioid-like compound derived from the kratom plant.  Labs Reviewed: Tox Screen positive only for Benzos, however, other substance above typically will not show on a drug screen. Ordered Vit D, B12, Mg for AM drawings to ascertain if these are contributory to depressive symptoms.  Medication Adjustments: Patient reports that he takes Wellbutrin  for Depression and anxiety, yet, he is using substances of abuse to treat same symptoms. He shares that he was on Wellbutrin  300 mg daily. We  are holding off on this medication, and will revisit it with him tomorrow to see if he can consider using another medication in the management of his symptoms as Wellbutrin  might be rendering him more anxious.  Librium  taper and Clonidine PRN added today.   We talked about discharge planning and how pt intends to maintain his sobriety after discharge, but he is unsure about going to rehab at this time. He has been encouraged to continue to explore his options with CSW and pharmacist, hospital  know. We will revisit discharge planning with him, and come up with a tentative discharge date, but he is continuing to benefit from being at the [facility] at this time-currently on a Librium  taper which is projected to run a course of 4 days.  Diagnosis:  Final diagnoses:  Opioid type dependence, continuous abuse (HCC)  Benzodiazepine dependence (HCC)   Total Time spent with patient: 45 minutes  Past Psychiatric History: polysubstance abuse, MDD, GAD Past Medical History:  Past Medical History:  Diagnosis Date   Anemia    Anxiety and depression    Epididymitis 05/01/2022   Herpes simplex type 2 infection     Family History:  Family History  Problem Relation Age of Onset   Anxiety disorder Mother    Diabetes Father    Heart disease Father    Parkinson's disease Father    Sleep apnea Father    Asthma Brother     Family Psychiatric  History: denies  Social History:  Social History   Socioeconomic History   Marital status: Married    Spouse name: Sharyne Roulette   Number of children: 2   Years of education: Not on file   Highest education level: Not on file  Occupational History   Not on file  Tobacco Use   Smoking status: Former    Types: Cigarettes   Smokeless tobacco: Current   Tobacco comments:    Nicotine  pouch   Vaping Use   Vaping status: Former  Substance and Sexual Activity   Alcohol use: No   Drug use: Not Currently    Types: Heroin    Comment: heroin recovery - 10 years ago   Sexual activity: Yes    Birth control/protection: I.U.D.  Other Topics Concern   Not on file  Social History Narrative   ** Merged History Encounter **       Social Drivers of Health   Tobacco Use: High Risk (09/27/2024)   Patient History    Smoking Tobacco Use: Former    Smokeless Tobacco Use: Current    Passive Exposure: Not on Actuary Strain: Not on file  Food Insecurity: No Food Insecurity (09/27/2024)   Epic    Worried About Programme Researcher, Broadcasting/film/video in the Last  Year: Never true    Ran Out of Food in the Last Year: Never true  Transportation Needs: No Transportation Needs (09/27/2024)   Epic    Lack of Transportation (Medical): No    Lack of Transportation (Non-Medical): No  Physical Activity: Not on file  Stress: Not on file  Social Connections: Not on file  Intimate Partner Violence: Unknown (09/27/2024)   Epic    Fear of Current or Ex-Partner: No    Emotionally Abused: No    Physically Abused: No    Sexually Abused: Not on file  Depression (PHQ2-9): Medium Risk (09/28/2024)   Depression (PHQ2-9)    PHQ-2 Score: 9  Alcohol Screen: Not on file  Housing: Not on file  Utilities: Not At Risk (09/27/2024)   Epic    Threatened with loss of utilities: No  Health Literacy: Not on file    Sleep: Fair  Appetite:  Fair  Current Medications:  Current Facility-Administered Medications  Medication Dose Route Frequency Provider Last Rate Last Admin   acetaminophen  (TYLENOL ) tablet 650 mg  650 mg Oral Q6H PRN Tex Drilling, NP   650 mg at 09/28/24 0943   alum & mag hydroxide-simeth (MAALOX/MYLANTA) 200-200-20 MG/5ML suspension 30 mL  30 mL Oral Q4H PRN Tex Drilling, NP       chlordiazePOXIDE  (LIBRIUM ) capsule 25 mg  25 mg Oral QID Tex Drilling, NP       Followed by   NOREEN ON 09/29/2024] chlordiazePOXIDE  (LIBRIUM ) capsule 25 mg  25 mg Oral TID Tex Drilling, NP       Followed by   NOREEN ON 09/30/2024] chlordiazePOXIDE  (LIBRIUM ) capsule 25 mg  25 mg Oral BH-qamhs Beckey Polkowski, NP       Followed by   NOREEN ON 10/01/2024] chlordiazePOXIDE  (LIBRIUM ) capsule 25 mg  25 mg Oral Daily Aaryn Parrilla, NP       dicyclomine  (BENTYL ) tablet 20 mg  20 mg Oral Q6H PRN Mannie Ashley SAILOR, MD       diphenhydrAMINE  (BENADRYL ) capsule 50 mg  50 mg Oral TID PRN Tex Drilling, NP       haloperidol  lactate (HALDOL ) injection 5 mg  5 mg Intramuscular TID PRN Tex Drilling, NP       And   diphenhydrAMINE  (BENADRYL ) injection 50 mg  50 mg Intramuscular TID PRN Tex Drilling, NP       And   LORazepam  (ATIVAN ) injection 2 mg  2 mg Intramuscular TID PRN Tex Drilling, NP       diphenhydrAMINE  (BENADRYL ) injection 50 mg  50 mg Intramuscular TID PRN Tex Drilling, NP       And   LORazepam  (ATIVAN ) injection 2 mg  2 mg Intramuscular TID PRN Tex Drilling, NP       hydrOXYzine  (ATARAX ) tablet 25 mg  25 mg Oral TID PRN Tex Drilling, NP   25 mg at 09/28/24 0940   loperamide  (IMODIUM ) capsule 2-4 mg  2-4 mg Oral PRN Mannie Ashley SAILOR, MD       magnesium  hydroxide (MILK OF MAGNESIA) suspension 30 mL  30 mL Oral Daily PRN Lynnette Barter, MD       methocarbamol  (ROBAXIN ) tablet 500 mg  500 mg Oral Q8H PRN Stevens, Briana N, MD       multivitamin with minerals tablet 1 tablet  1 tablet Oral Daily Mannie Ashley SAILOR, MD   1 tablet at 09/28/24 0940   naproxen  (NAPROSYN ) tablet 500 mg  500 mg Oral BID PRN Stevens, Briana N, MD       nicotine  (NICODERM CQ  - dosed in mg/24 hours) patch 21 mg  21 mg Transdermal Daily Gottfried, Rhoda J, MD   21 mg at 09/28/24 0940   nicotine  polacrilex (NICORETTE ) gum 2 mg  2 mg Oral PRN Gottfried, Rhoda J, MD       ondansetron  (ZOFRAN -ODT) disintegrating tablet 4 mg  4 mg Oral Q6H PRN Mannie Ashley SAILOR, MD       thiamine  (VITAMIN B1) injection 100 mg  100 mg Intramuscular Once Markevious Ehmke, NP       traZODone  (DESYREL ) tablet 50 mg  50 mg Oral QHS PRN Lynnette Barter, MD       Current Outpatient Medications  Medication Sig Dispense Refill  Buprenorphine HCl-Naloxone HCl 4-1 MG FILM Place 1 Film under the tongue daily.     buPROPion  (WELLBUTRIN  XL) 300 MG 24 hr tablet Take 1 tablet (300 mg total) by mouth daily. 90 tablet 3   valACYclovir  (VALTREX ) 1000 MG tablet Take 1 tablet (1,000 mg total) by mouth daily. 90 tablet 3    Labs  Lab Results:  Admission on 09/27/2024, Discharged on 09/27/2024  Component Date Value Ref Range Status   WBC 09/27/2024 8.1  4.0 - 10.5 K/uL Final   RBC 09/27/2024 4.68  4.22 - 5.81 MIL/uL Final   Hemoglobin  09/27/2024 14.3  13.0 - 17.0 g/dL Final   HCT 98/94/7973 42.7  39.0 - 52.0 % Final   MCV 09/27/2024 91.2  80.0 - 100.0 fL Final   MCH 09/27/2024 30.6  26.0 - 34.0 pg Final   MCHC 09/27/2024 33.5  30.0 - 36.0 g/dL Final   RDW 98/94/7973 12.3  11.5 - 15.5 % Final   Platelets 09/27/2024 209  150 - 400 K/uL Final   nRBC 09/27/2024 0.0  0.0 - 0.2 % Final   Performed at [facility], [address] [place], [place] [address]   Sodium 09/27/2024 139  135 - 145 mmol/L Final   Potassium 09/27/2024 4.5  3.5 - 5.1 mmol/L Final   Chloride 09/27/2024 102  98 - 111 mmol/L Final   CO2 09/27/2024 27  22 - 32 mmol/L Final   Glucose, Bld 09/27/2024 90  70 - 99 mg/dL Final   Glucose reference range applies only to samples taken after fasting for at least 8 hours.   BUN 09/27/2024 17  6 - 20 mg/dL Final   Creatinine, Ser 09/27/2024 0.94  0.61 - 1.24 mg/dL Final   Calcium 98/94/7973 9.3  8.9 - 10.3 mg/dL Final   Total Protein 98/94/7973 7.3  6.5 - 8.1 g/dL Final   Albumin 98/94/7973 4.9  3.5 - 5.0 g/dL Final   AST 98/94/7973 39  15 - 41 U/L Final   ALT 09/27/2024 40  0 - 44 U/L Final   Alkaline Phosphatase 09/27/2024 51  38 - 126 U/L Final   Total Bilirubin 09/27/2024 0.5  0.0 - 1.2 mg/dL Final   GFR, Estimated 09/27/2024 >60  >60 mL/min Final   Comment: (NOTE) Calculated using the CKD-EPI Creatinine Equation (2021)    Anion gap 09/27/2024 10  5 - 15 Final   Performed at [facility], [address] [place], [place] [address]   POC Amphetamine UR 09/27/2024 None Detected  NONE DETECTED (Cut Off Level 1000 ng/mL) Final   POC Secobarbital (BAR) 09/27/2024 None Detected  NONE DETECTED (Cut Off Level 300 ng/mL) Final   POC Buprenorphine (BUP) 09/27/2024 None Detected  NONE DETECTED (Cut Off Level 10 ng/mL) Final   POC Oxazepam (BZO) 09/27/2024 Positive (A)  NONE DETECTED (Cut Off Level 300 ng/mL) Final   POC Cocaine UR 09/27/2024 None Detected  NONE DETECTED (Cut Off Level 300 ng/mL) Final    POC Methamphetamine UR 09/27/2024 None Detected  NONE DETECTED (Cut Off Level 1000 ng/mL) Final   POC Morphine 09/27/2024 None Detected  NONE DETECTED (Cut Off Level 300 ng/mL) Final   POC Methadone UR 09/27/2024 None Detected  NONE DETECTED (Cut Off Level 300 ng/mL) Final   POC Oxycodone UR 09/27/2024 None Detected  NONE DETECTED (Cut Off Level 100 ng/mL) Final   POC Marijuana UR 09/27/2024 None Detected  NONE DETECTED (Cut Off Level 50 ng/mL) Final   Alcohol,  Ethyl (B) 09/27/2024 <15  <15 mg/dL Final   Comment: (NOTE) For medical purposes only. Performed at [facility], [address] [place], [place] [address]   Office Visit on 05/05/2024  Component Date Value Ref Range Status   WBC 05/05/2024 6.9  4.0 - 10.5 K/uL Final   RBC 05/05/2024 4.37  4.22 - 5.81 Mil/uL Final   Platelets 05/05/2024 189.0  150.0 - 400.0 K/uL Final   Hemoglobin 05/05/2024 13.2  13.0 - 17.0 g/dL Final   HCT 91/86/7974 39.7  39.0 - 52.0 % Final   MCV 05/05/2024 90.9  78.0 - 100.0 fl Final   MCHC 05/05/2024 33.2  30.0 - 36.0 g/dL Final   RDW 91/86/7974 13.4  11.5 - 15.5 % Final   Sodium 05/05/2024 140  135 - 145 mEq/L Final   Potassium 05/05/2024 4.1  3.5 - 5.1 mEq/L Final   Chloride 05/05/2024 104  96 - 112 mEq/L Final   CO2 05/05/2024 30  19 - 32 mEq/L Final   Glucose, Bld 05/05/2024 87  70 - 99 mg/dL Final   BUN 91/86/7974 14  6 - 23 mg/dL Final   Creatinine, Ser 05/05/2024 1.02  0.40 - 1.50 mg/dL Final   Total Bilirubin 05/05/2024 0.5  0.2 - 1.2 mg/dL Final   Alkaline Phosphatase 05/05/2024 41  39 - 117 U/L Final   AST 05/05/2024 20  0 - 37 U/L Final   ALT 05/05/2024 17  0 - 53 U/L Final   Total Protein 05/05/2024 6.6  6.0 - 8.3 g/dL Final   Albumin 91/86/7974 4.6  3.5 - 5.2 g/dL Final   GFR 91/86/7974 93.32  >60.00 mL/min Final   Calculated using the CKD-EPI Creatinine Equation (2021)   Calcium 05/05/2024 9.1  8.4 - 10.5 mg/dL Final   Cholesterol 91/86/7974 158  0 - 200 mg/dL Final   ATP III  Classification       Desirable:  < 200 mg/dL               Borderline High:  200 - 239 mg/dL          High:  > = 759 mg/dL   Triglycerides 91/86/7974 61.0  0.0 - 149.0 mg/dL Final   Normal:  <849 mg/dLBorderline High:  150 - 199 mg/dL   HDL 91/86/7974 40.09  >39.00 mg/dL Final   VLDL 91/86/7974 12.2  0.0 - 40.0 mg/dL Final   LDL Cholesterol 05/05/2024 86  0 - 99 mg/dL Final   Total CHOL/HDL Ratio 05/05/2024 3   Final                  Men          Women1/2 Average Risk     3.4          3.3Average Risk          5.0          4.42X Average Risk          9.6          7.13X Average Risk          15.0          11.0                       NonHDL 05/05/2024 97.95   Final   NOTE:  Non-HDL goal should be 30 mg/dL higher than patient's LDL goal (i.e. LDL goal of < 70 mg/dL, would have non-HDL goal  of < 100 mg/dL)   TSH 91/86/7974 9.12  0.35 - 5.50 uIU/mL Final   Hgb A1c MFr Bld 05/05/2024 5.9  4.6 - 6.5 % Final   Glycemic Control Guidelines for People with Diabetes:Non Diabetic:  <6%Goal of Therapy: <7%Additional Action Suggested:  >8%     Blood Alcohol level:  Lab Results  Component Value Date   [facility] <15 09/27/2024    Metabolic Disorder Labs: Lab Results  Component Value Date   HGBA1C 5.9 05/05/2024   No results found for: PROLACTIN Lab Results  Component Value Date   CHOL 158 05/05/2024   TRIG 61.0 05/05/2024   HDL 59.90 05/05/2024   CHOLHDL 3 05/05/2024   VLDL 12.2 05/05/2024   LDLCALC 86 05/05/2024    Therapeutic Lab Levels: No results found for: LITHIUM No results found for: VALPROATE No results found for: CBMZ  Physical Findings   GAD-7    Flowsheet Row Office Visit from 05/05/2024 in [facility] Office Visit from 09/10/2023 in [facility]  Total GAD-7 Score 2 4   PHQ2-9    Flowsheet Row ED from 09/27/2024 in [facility] Office Visit from 05/05/2024 in [facility]  [facility]Care at [facility] Office Visit from 09/10/2023 in [facility] [place]  PHQ-2 Total Score 2 1 2   PHQ-9 Total Score 9 5 5    Flowsheet Row ED from 09/27/2024 in [facility] Most recent reading at 09/28/2024 11:40 AM ED from 09/27/2024 in [facility] Most recent reading at 09/27/2024 11:08 AM  C-SSRS RISK CATEGORY Error: Q3, 4, or 5 should not be populated when Q2 is No No Risk     Musculoskeletal  Strength & Muscle Tone: within normal limits Gait & Station: normal Patient leans: N/A  Psychiatric Specialty Exam  Presentation  General Appearance:  Casual  Eye Contact: Fair  Speech: Clear and Coherent  Speech Volume: Normal  Handedness:Right   Mood and Affect  Mood: Depressed; Anxious  Affect: Congruent   Thought Process  Thought Processes: Coherent  Descriptions of Associations:Intact  Orientation:Full (Time, Place and Person)  Thought Content:Logical  Diagnosis of Schizophrenia or Schizoaffective disorder in past: No    Hallucinations:Hallucinations: None  Ideas of Reference:None  Suicidal Thoughts:Suicidal Thoughts: No  Homicidal Thoughts:Homicidal Thoughts: No   Sensorium  Memory: Immediate Fair  Judgment: Fair  Insight: Fair   Art Therapist  Concentration: Good  Attention Span: Good  Recall: Good  Fund of Knowledge: Good  Language: Good   Psychomotor Activity  Psychomotor Activity: Psychomotor Activity: Normal   Assets  Assets: Communication Skills   Sleep  Sleep: Sleep: Fair  Estimated Sleeping Duration (Last 24 Hours): 12.25-15.00 hours  Nutritional Assessment (For OBS and FBC admissions only) Has the patient had a weight loss or gain of 10 pounds or more in the last 3 months?: No Has the patient had a decrease in food intake/or appetite?: No Does the patient have dental problems?: No Does the patient have eating  habits or behaviors that may be indicators of an eating disorder including binging or inducing vomiting?: No Has the patient recently lost weight without trying?: 0 Has the patient been eating poorly because of a decreased appetite?: 0 Malnutrition Screening Tool Score: 0    Physical Exam  Physical Exam Neurological:     General: No focal deficit present.     Mental Status: He is oriented to  person, place, and time.  Psychiatric:        Behavior: Behavior normal.        Thought Content: Thought content normal.    Review of Systems  Psychiatric/Behavioral:  Positive for depression and substance abuse. Negative for hallucinations, memory loss and suicidal ideas. The patient is nervous/anxious and has insomnia.   All other systems reviewed and are negative.  Blood pressure 104/65, pulse 77, temperature 98.3 F (36.8 C), temperature source Oral, resp. rate 18, SpO2 100%. There is no height or weight on file to calculate BMI.  Treatment Plan Summary: Daily contact with patient to assess and evaluate symptoms and progress in treatment and Medication management  Safety and Monitoring: Voluntary admission to inpatient psychiatric unit for safety, stabilization and treatment Daily contact with patient to assess and evaluate symptoms and progress in treatment Patient's case to be discussed in multi-disciplinary team meeting Observation Level : q15 minute checks Vital signs: q12 hours Precautions: Safety  Long Term Goal(s): Improvement in symptoms so as ready for discharge  Short Term Goals: Ability to identify changes in lifestyle to reduce recurrence of condition will improve, Ability to verbalize feelings will improve, Ability to disclose and discuss suicidal ideas, Ability to demonstrate self-control will improve, Ability to identify and develop effective coping behaviors will improve, Ability to maintain clinical measurements within normal limits will improve, Compliance with  prescribed medications will improve, and Ability to identify triggers associated with substance abuse/mental health issues will improve  Diagnoses Principal Problem:   Opioid use disorder, moderate, dependence (HCC) Active Problems:   Polysubstance dependence (HCC)  1/6/202611:43 AM   Scheduled Meds:  chlordiazePOXIDE   25 mg Oral QID   Followed by   NOREEN ON 09/29/2024] chlordiazePOXIDE   25 mg Oral TID   Followed by   NOREEN ON 09/30/2024] chlordiazePOXIDE   25 mg Oral BH-qamhs   Followed by   NOREEN ON 10/01/2024] chlordiazePOXIDE   25 mg Oral Daily   multivitamin with minerals  1 tablet Oral Daily   nicotine   21 mg Transdermal Daily   thiamine   100 mg Intramuscular Once    PRN meds Current Facility-Administered Medications:    dicyclomine  (BENTYL ) tablet 20 mg, 20 mg, Oral, Q6H PRN   loperamide  (IMODIUM ) capsule 2-4 mg, 2-4 mg, Oral, PRN   methocarbamol  (ROBAXIN ) tablet 500 mg, 500 mg, Oral, Q8H PRN   naproxen  (NAPROSYN ) tablet 500 mg, 500 mg, Oral, BID PRN   ondansetron  (ZOFRAN -ODT) disintegrating tablet 4 mg, 4 mg, Oral, Q6H PRN   magnesium  hydroxide (MILK OF MAGNESIA) suspension 30 mL, 30 mL, Oral, Daily PRN   traZODone  (DESYREL ) tablet 50 mg, 50 mg, Oral, QHS PRN   acetaminophen  (TYLENOL ) tablet 650 mg, 650 mg, Oral, Q6H PRN, 650 mg at 09/28/24 0943   alum & mag hydroxide-simeth (MAALOX/MYLANTA) 200-200-20 MG/5ML suspension 30 mL, 30 mL, Oral, Q4H PRN   [DISCONTINUED] haloperidol  (HALDOL ) tablet 5 mg, 5 mg, Oral, TID PRN **AND** diphenhydrAMINE  (BENADRYL ) capsule 50 mg, 50 mg, Oral, TID PRN   haloperidol  lactate (HALDOL ) injection 5 mg, 5 mg, Intramuscular, TID PRN **AND** diphenhydrAMINE  (BENADRYL ) injection 50 mg, 50 mg, Intramuscular, TID PRN **AND** LORazepam  (ATIVAN ) injection 2 mg, 2 mg, Intramuscular, TID PRN   [DISCONTINUED] haloperidol  lactate (HALDOL ) injection 10 mg, 10 mg, Intramuscular, TID PRN **AND** diphenhydrAMINE  (BENADRYL ) injection 50 mg, 50 mg, Intramuscular, TID  PRN **AND** LORazepam  (ATIVAN ) injection 2 mg, 2 mg, Intramuscular, TID PRN   hydrOXYzine  (ATARAX ) tablet 25 mg, 25 mg, Oral, TID PRN, 25 mg at 09/28/24  0940   nicotine  polacrilex (NICORETTE ) gum 2 mg, 2 mg, Oral, PRN   Discharge Planning: Social work and case management to assist with discharge planning and identification of hospital follow-up needs prior to discharge Estimated LOS: 3-5 days Discharge Concerns: Need to establish a safety plan; Medication compliance and effectiveness Discharge Goals: Return home with outpatient referrals for mental health follow-up including medication management/psychotherapy  I certify that inpatient services furnished can reasonably be expected to improve the patient's condition.   Donia Snell, NP Donia Snell, NP 09/28/2024 11:43 AM

## 2024-09-28 NOTE — Group Note (Signed)
 Group Topic: Emotional Regulation  Group Date: 09/28/2024 Start Time: 1715 End Time: 1740 Facilitators: Vincente Asberry CROME, RN  Department: [facility]  Number of Participants: 6  Group Focus: coping skills Treatment Modality:  Psychoeducation Interventions utilized were patient education Purpose: enhance coping skills  Name: Mark Bender Date of Birth: 09-Sep-1986  MR: [id number]    Level of Participation: minimal Quality of Participation: drowsy Interactions with others: none Mood/Affect: depressed and flat Triggers (if applicable):  Cognition: coherent/clear Progress: Minimal Response:   Plan: patient will be encouraged to continue to use relaxation techniques PRN   Patients Problems:  Patient Active Problem List   Diagnosis Date Noted   Opioid use disorder, moderate, dependence (HCC) 09/27/2024   Polysubstance dependence (HCC) 09/27/2024   Encounter for screening and preventative care 05/05/2024   Mild obstructive sleep apnea 01/16/2024   Excessive daytime sleepiness 11/21/2023   Anemia 09/10/2023   History of herpes simplex type 2 infection 09/10/2023   Anxiety and depression 09/10/2023   Snoring 09/10/2023   Hep C w/o coma, chronic (HCC) 09/10/2023   Cannabis dependence, uncomplicated (HCC) 10/25/2022   Increased frequency of urination 05/02/2022

## 2024-09-28 NOTE — Group Note (Signed)
 Group Topic: Communication  Group Date: 09/28/2024 Start Time: 1930 End Time: 2000 Facilitators: Verdon Jacqualyn BRAVO, NT  Department: [facility]  Number of Participants: 4 Group Focus: anger management Treatment Modality:  Individual Therapy Interventions utilized were confrontation Purpose: increase insight  Name: Mark Bender Date of Birth: April 30, 1986  MR: [id number]    Level of Participation: active Quality of Participation: cooperative Interactions with others: gave feedback Mood/Affect: appropriate Triggers (if applicable): N/A Cognition: coherent/clear Progress: Moderate Response: N/A Plan: follow-up needed  Patients Problems:  Patient Active Problem List   Diagnosis Date Noted   Opioid use disorder, moderate, dependence (HCC) 09/27/2024   Polysubstance dependence (HCC) 09/27/2024   Encounter for screening and preventative care 05/05/2024   Mild obstructive sleep apnea 01/16/2024   Excessive daytime sleepiness 11/21/2023   Anemia 09/10/2023   History of herpes simplex type 2 infection 09/10/2023   Anxiety and depression 09/10/2023   Snoring 09/10/2023   Hep C w/o coma, chronic (HCC) 09/10/2023   Cannabis dependence, uncomplicated (HCC) 10/25/2022   Increased frequency of urination 05/02/2022

## 2024-09-28 NOTE — Group Note (Signed)
 Group Topic: Emotional Regulation  Group Date: 09/28/2024 Start Time: 2000 End Time: 2030 Facilitators: Carollynn Genre, NT  Department: [facility]  Number of Participants: 6  Group Focus: acceptance Treatment Modality:  Individual Therapy Interventions utilized were mental fitness Purpose: regain self-worth  Name: Mark Bender Date of Birth: 09-04-1986  MR: [id number]    Level of Participation: active Quality of Participation: cooperative Interactions with others: gave feedback Mood/Affect: appropriate Triggers (if applicable): N A Cognition: coherent/clear Progress: Gaining insight Response: GOOD Plan: changes to treatment plan  Patients Problems:  Patient Active Problem List   Diagnosis Date Noted   Opioid use disorder, moderate, dependence (HCC) 09/27/2024   Polysubstance dependence (HCC) 09/27/2024   Encounter for screening and preventative care 05/05/2024   Mild obstructive sleep apnea 01/16/2024   Excessive daytime sleepiness 11/21/2023   Anemia 09/10/2023   History of herpes simplex type 2 infection 09/10/2023   Anxiety and depression 09/10/2023   Snoring 09/10/2023   Hep C w/o coma, chronic (HCC) 09/10/2023   Cannabis dependence, uncomplicated (HCC) 10/25/2022   Increased frequency of urination 05/02/2022
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
## 2024-09-28 NOTE — Care Management (Signed)
 FBC Care Management...  Writer received a phone call from Lissa Caldron at [employer].  She requested to complete the phone interview with the client.    Client asks Lissa Caldron about the program in details.  After she explains the program he states he would like to call and talk with his job first to confirm he has enough time to complete a 30 day inpatient program.  Writer encouraged the client to call HR at his job tomorrow to confirm FMLA and time off.  Writer informed Lissa Caldron we will call her back once he confirms his FMLA.

## 2024-09-28 NOTE — Group Note (Signed)
 Group Topic: Trust and Honesty  Group Date: 09/28/2024 Start Time: 1100 End Time: 1150 Facilitators: Gerome Jolly, NT  Department: [facility]  Number of Participants: 2  Group Focus: chemical dependency education, chemical dependency issues, family, and substance abuse education Treatment Modality:  Behavior Modification Therapy, Cognitive Behavioral Therapy, and Psychoeducation Interventions utilized were clarification, exploration, patient education, and story telling Purpose: explore maladaptive thinking, improve communication skills, and relapse prevention strategies  Name: Mark Bender Date of Birth: 1986-04-19  MR: [id number]    Level of Participation: moderate Quality of Participation: attentive, cooperative, and engaged Interactions with others: gave feedback Mood/Affect: appropriate and flat Triggers (if applicable): na Cognition: coherent/clear and guilty Progress: Minimal Response: Pt was able to identify areas where he achieved sobriety and the steps it took. Pt identified areas where he stopped doing what was helping him maintain his recovery      Plan: follow-up needed  Patients Problems:  Patient Active Problem List   Diagnosis Date Noted   Opioid use disorder, moderate, dependence (HCC) 09/27/2024   Polysubstance dependence (HCC) 09/27/2024   Encounter for screening and preventative care 05/05/2024   Mild obstructive sleep apnea 01/16/2024   Excessive daytime sleepiness 11/21/2023   Anemia 09/10/2023   History of herpes simplex type 2 infection 09/10/2023   Anxiety and depression 09/10/2023   Snoring 09/10/2023   Hep C w/o coma, chronic (HCC) 09/10/2023   Cannabis dependence, uncomplicated (HCC) 10/25/2022   Increased frequency of urination 05/02/2022
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
## 2024-09-28 NOTE — ED Notes (Signed)
Pt sleeping at present, no distress noted,  Monitoring for safety. 

## 2024-09-28 NOTE — Group Note (Signed)
 Group Topic: Social Support  Group Date: 09/28/2024 Start Time: 1245 End Time: 1315 Facilitators: Deidre Prentis CROME, NT  Department: [facility]  Number of Participants: 1  Group Focus: community group Treatment Modality:  Psychoeducation Interventions utilized were support Purpose: express feelings  Name: Mark Bender Date of Birth: [birth year]  MR: [id number]    Level of Participation: pt did not attend group.  Patients Problems:  Patient Active Problem List   Diagnosis Date Noted   Opioid use disorder, moderate, dependence (HCC) 09/27/2024   Polysubstance dependence (HCC) 09/27/2024   Encounter for screening and preventative care 05/05/2024   Mild obstructive sleep apnea 01/16/2024   Excessive daytime sleepiness 11/21/2023   Anemia 09/10/2023   History of herpes simplex type 2 infection 09/10/2023   Anxiety and depression 09/10/2023   Snoring 09/10/2023   Hep C w/o coma, chronic (HCC) 09/10/2023   Cannabis dependence, uncomplicated (HCC) 10/25/2022   Increased frequency of urination 05/02/2022

## 2024-09-28 NOTE — ED Notes (Signed)
Pt A&O x 4, no distress noted., calm & cooperative, monitoring for safety. 

## 2024-09-29 DIAGNOSIS — F411 Generalized anxiety disorder: Secondary | ICD-10-CM | POA: Diagnosis not present

## 2024-09-29 DIAGNOSIS — F1120 Opioid dependence, uncomplicated: Secondary | ICD-10-CM | POA: Diagnosis not present

## 2024-09-29 DIAGNOSIS — F32A Depression, unspecified: Secondary | ICD-10-CM | POA: Diagnosis not present

## 2024-09-29 DIAGNOSIS — F1320 Sedative, hypnotic or anxiolytic dependence, uncomplicated: Secondary | ICD-10-CM | POA: Diagnosis not present

## 2024-09-29 NOTE — ED Notes (Signed)
 Pt sleeping at present, no distress noted.  Monitoring for safety.

## 2024-09-29 NOTE — Discharge Instructions (Addendum)
 Discharge recommendations:   Medications: Patient is to take medications as prescribed. The patient or patient's guardian is to contact a medical professional and/or outpatient provider to address any new side effects that develop. The patient or the patient's guardian should update outpatient providers of any new medications and/or medication changes.    Outpatient Follow up: Please review list of outpatient resources for psychiatry and counseling. Please follow up with your primary care provider for all medical related needs.    Therapy: We recommend that patient participate in individual therapy to address mental health concerns.  Safety:   The following safety precautions should be taken:   No sharp objects. This includes scissors, razors, scrapers, and putty knives.   Chemicals should be removed and locked up.   Medications should be removed and locked up.   Weapons should be removed and locked up. This includes firearms, knives and instruments that can be used to cause injury.   The patient should abstain from use of illicit substances/drugs and abuse of any medications.  If symptoms worsen or do not continue to improve or if the patient becomes actively suicidal or homicidal then it is recommended that the patient return to the closest hospital emergency department, the [facility], or call 911 for further evaluation and treatment. National Suicide Prevention Lifeline 1-800-SUICIDE or 412 243 8922.  About 988 988 offers 24/7 access to trained crisis counselors who can help people experiencing mental health-related distress. People can call or text 988 or chat 988lifeline.org for themselves or if they are worried about a loved one who may need crisis support.     FBC Care Management...  Writer received verbal consent to speak with patients sponsor Mark Bender [phone]  Writer spoke with Mark Bender,  Per Mark Bender, he will be able to pick patient up on  Friday 10/01/2024 at 9 am  Patient will discharge Friday 10/01/2024 by 9:00 am  Patient will arrange transportation  Writer will provide inpatient and out patient resources  30 day Scripts  Per [facility],  Patient  has been accepted at [facility], appointment scheduled for Wednesday 10/06/2024 @ 9:00 am. Patient will follow-up with [facility], after discharging [facility] [address], [place], [place] [address] Phone: [phone]                                                                                                Out Patient Resources  Outpatient Services for Therapy and Medication Management for Medicaid  Based on what you have shared, a list of resources for outpatient therapy and psychiatry is provided below to get you started back on treatment.  It is imperative that you follow through with treatment within 5-7 days from the day of discharge to prevent any further risk to your safety or mental well-being.  You are not limited to the list provided.  In case of an urgent crisis, you may contact the Mobile Crisis Unit with [facility] at [phone].          [facility] [address] SECOND FLOOR [place], [place] [address] [phone] OUTPATIENT Walk-in information: Please note, all walk-ins are first come & first serve, with limited number of availability.  Please note that to be eligible for services you must bring: ID or a piece of mail with your name [facility] address  Therapist for therapy:  Monday & Wednesdays: Please ARRIVE at 7:15 AM for registration Will START at 8:00 AM Every 1st & 2nd Friday of the month: Please ARRIVE at 10:15 AM for registration Will START at 1 PM - 5 PM  Psychiatrist for medication management: Monday - Friday:  Please ARRIVE at 7:15 AM for registration Will START at 8:00 AM  Regretfully, due to limited availability, please be aware that you may not been  seen on the same day as walk-in. Please consider making an appoint or try again. Thank you for your patience and understanding.    [facility] [address], [address] [place], [place], [address] [phone] phone  [facility] [address] [place], [place], [address] [phone] phone [phone] phone ([address], [address], Anthem/Elevance, [address], [place], [address], [address], Healthy [place], [place], [place], [address], [employer], [place], UHC, [employer], [place], Out of Network)  [employer], [place] [address] [place], [place], [address] [phone] phone (Teller, Anthem/Elevance, Sanmina-sci Options/Carelon, BCBS, [address], [place], [place], [place], [place], [employer], [place], [place], [place], [facility])  [employer] [address] [place], [place], [address] [phone] phone (Medicaid, ask about other insurance)  [facility] [address] [place], [place], [address] [phone] phone [phone] fax ([address] [place] , [place], [place], [place] Health Choice, UHC, GENERAL ELECTRIC, Self-Pay)  Sarah Lempka [address] [facility] [address] [place], [place], [address] [phone] phone ([address], Anthem/Elevance, [address], [address], [place], [employer], [place], [place], [place], [employer], [place], [place], [place], [facility])  [facility] - 6-8 MONTH WAIT FOR THERAPY; [facility] [address], [address] [place], [place], [address] [phone] phone ([address] AmeriHealth [address] - [place], [address], [place], [place], Friday Health Plans, [address], BCBS Healthy [place], [place], [address], [place], [place], [place], [place], Tricare, UHC, [employer], [place])  [facility] [address], [address] [place], [place], [address] [phone] phone  [facility] [address], [address] [place], [place], [address] [phone] phone  [facility] [address] [place], [place], [address] [phone] phone  [facility] - THERAPY ONLY [address]  [place], [place], [address] [phone] phone  [facility], [place] [address], [place], [address] [phone] phone  [facility] [address] [place], [place], [address] [phone] phone [phone] fax  [facility] [address] [place], [place], [address] [phone] phone [phone] fax  [facility] 505 452 [address] [place], [place], [address] [phone] phone  Janit Griffins [address] [place], [place], [address]  [phone] phone  [facility]  (Adults Only) [address] [place], [place], [address]  [phone] phone [phone] fax

## 2024-09-29 NOTE — ED Notes (Signed)
 Pt ate lunch. Pt was provided with mother's phone number to call her as requested. Pt generally cooperative, presents with flat affect.

## 2024-09-29 NOTE — Group Note (Signed)
 Group Topic: Balance in Life  Group Date: 09/29/2024 Start Time: 1300 End Time: 1325 Facilitators: Deidre Prentis CROME, NT  Department: [facility]  Number of Participants: 2  Group Focus: chemical dependency issues Treatment Modality:  Psychoeducation Interventions utilized were support Purpose: reinforce self-care  Name: Mark Bender Date of Birth: 1986/07/20  MR: [id number]    Level of Participation: pt did not attend group.  Patients Problems:  Patient Active Problem List   Diagnosis Date Noted   Opioid use disorder, moderate, dependence (HCC) 09/27/2024   Polysubstance dependence (HCC) 09/27/2024   Encounter for screening and preventative care 05/05/2024   Mild obstructive sleep apnea 01/16/2024   Excessive daytime sleepiness 11/21/2023   Anemia 09/10/2023   History of herpes simplex type 2 infection 09/10/2023   Anxiety and depression 09/10/2023   Snoring 09/10/2023   Hep C w/o coma, chronic (HCC) 09/10/2023   Cannabis dependence, uncomplicated (HCC) 10/25/2022   Increased frequency of urination 05/02/2022

## 2024-09-29 NOTE — ED Provider Notes (Signed)
 Behavioral Health Progress Note  Date and Time: 09/29/2024 1:05 PM Name: Mark Bender MRN:  [id number]  HPI: Per initial assessment:  Mark Bender is a 39 year-old-male with a past history of opioid use disorder, anxiety, and depression presenting at [facility] for help with detox. Patient reports that one month ago he relapsed and began using methadone, and two weeks ago also began using diazepam. Reports his most recent use was yesterday and he has been using daily. Prior to relapse, he had been on Suboxone 4 mg daily for the past 6 years, but began tapering off of it a few weeks ago because he wanted to use again. He also reports his had been keeping his Suboxone prescription a secret from his wife, but she found out about the prescription after seeing a paper insert from the prescription around their house. She reportedly gave him an ultimatum to stop the medication. Patient reports his wife also has a history of substance use and is in remission. Raenell, Ashley SAILOR, MD, Date of Service: 09/27/2024  1:04 PM).   24 hr chart review: Sleep Hours last night: pretty good as per pt's reports Nursing Concerns: none reported/noted  Behavioral episodes in the past 24 hrs: none  Medication Compliance: Remains Compliant  Vital Signs in the past 24 hrs: WNL PRN Medications in the past 24 hrs: Tylenol  & Hydroxyzine   Patient assessment, 09/30/2023:  During today's encounter, mood & anxiety are both much improved as compared to yesterday's presentation. Pt reports that he has an okay night of sleep, reports a good appetite, but shares that it would be beneficial if he could get more food choices while at the [facility]. Educated that whenever food is being served, he can ask nursing to see if he can get more. He reports some sleep disruption last night, due to the bed being uncomfortable for him, and shares that he required a pillow to support his back so as to get some sleep. He denies any history of back  problems, but shares that the beds were uncomfortable.He continues to describe a low energy level today, but states that it is from the lack of sleep. Continuing to deny SI/HI/AVH, denies paranoia and denies delusional thoughts. There are no overt signs of psychosis.  Overall, pt reports that the Librium  taper is very helpful in alleviating the physiological symptoms of withdrawals related to opioids & Benzos. Withdrawal symptoms today are minimal today on the Librium  taper.  We talked about how pt plans to maintain his sobriety after discharge, he he shares that he plans to indulge in NA, shares that his sponsor will be picking him up, and taking him home where he will meet with his wife and go from there. He denies medication related side effects, denies other concerns, we are continuing mediations as listed on the the [facility], with a plan to discharge patient on 10/01/2024 after his taper ends.  Labs Reviewed: Tox Screen positive only for Benzos, however, other substance above typically will not show on a drug screen. Ordered Vit D, B12, Mg for AM drawings to ascertain if these are contributory to depressive symptoms.  Medication Adjustments: Patient reports that he takes Wellbutrin  for Depression and anxiety, yet, he is using substances of abuse to treat same symptoms. He shares that he was on Wellbutrin  300 mg daily. We are continuing to holding off on this medication, given that the Wellbutrin  might be contributory to his anxiety symptoms.  Diagnosis:  Final diagnoses:  Opioid type dependence, continuous  abuse (HCC)  Benzodiazepine dependence (HCC)   Total Time spent with patient: 45 minutes  Past Psychiatric History: polysubstance abuse, MDD, GAD Past Medical History:  Past Medical History:  Diagnosis Date   Anemia    Anxiety and depression    Epididymitis 05/01/2022   Herpes simplex type 2 infection     Family History:  Family History  Problem Relation Age of Onset   Anxiety  disorder Mother    Diabetes Father    Heart disease Father    Parkinson's disease Father    Sleep apnea Father    Asthma Brother     Family Psychiatric  History: denies  Social History:  Social History   Socioeconomic History   Marital status: Married    Spouse name: Sharyne Roulette   Number of children: 2   Years of education: Not on file   Highest education level: Not on file  Occupational History   Not on file  Tobacco Use   Smoking status: Former    Types: Cigarettes   Smokeless tobacco: Current   Tobacco comments:    Nicotine  pouch   Vaping Use   Vaping status: Former  Substance and Sexual Activity   Alcohol use: No   Drug use: Not Currently    Types: Heroin    Comment: heroin recovery - 10 years ago   Sexual activity: Yes    Birth control/protection: I.U.D.  Other Topics Concern   Not on file  Social History Narrative   ** Merged History Encounter **       Social Drivers of Health   Tobacco Use: High Risk (09/27/2024)   Patient History    Smoking Tobacco Use: Former    Smokeless Tobacco Use: Current    Passive Exposure: Not on Actuary Strain: Not on file  Food Insecurity: No Food Insecurity (09/27/2024)   Epic    Worried About Programme Researcher, Broadcasting/film/video in the Last Year: Never true    Ran Out of Food in the Last Year: Never true  Transportation Needs: No Transportation Needs (09/27/2024)   Epic    Lack of Transportation (Medical): No    Lack of Transportation (Non-Medical): No  Physical Activity: Not on file  Stress: Not on file  Social Connections: Not on file  Intimate Partner Violence: Unknown (09/27/2024)   Epic    Fear of Current or Ex-Partner: No    Emotionally Abused: No    Physically Abused: No    Sexually Abused: Not on file  Depression (PHQ2-9): Medium Risk (09/28/2024)   Depression (PHQ2-9)    PHQ-2 Score: 9  Alcohol Screen: Not on file  Housing: Not on file  Utilities: Not At Risk (09/27/2024)   Epic    Threatened with loss of  utilities: No  Health Literacy: Not on file    Sleep: Fair  Appetite:  Fair  Current Medications:  Current Facility-Administered Medications  Medication Dose Route Frequency Provider Last Rate Last Admin   acetaminophen  (TYLENOL ) tablet 650 mg  650 mg Oral Q6H PRN Tex Drilling, NP   650 mg at 09/28/24 0943   alum & mag hydroxide-simeth (MAALOX/MYLANTA) 200-200-20 MG/5ML suspension 30 mL  30 mL Oral Q4H PRN Tex Drilling, NP       chlordiazePOXIDE  (LIBRIUM ) capsule 25 mg  25 mg Oral TID Armoni Depass, NP   25 mg at 09/29/24 9076   Followed by   NOREEN ON 09/30/2024] chlordiazePOXIDE  (LIBRIUM ) capsule 25 mg  25 mg Oral BH-qamhs Michella Detjen,  NP       Followed by   NOREEN ON 10/01/2024] chlordiazePOXIDE  (LIBRIUM ) capsule 25 mg  25 mg Oral Daily Bryson Palen, NP       dicyclomine  (BENTYL ) tablet 20 mg  20 mg Oral Q6H PRN Mannie Ashley SAILOR, MD       diphenhydrAMINE  (BENADRYL ) capsule 50 mg  50 mg Oral TID PRN Tex Drilling, NP       haloperidol  lactate (HALDOL ) injection 5 mg  5 mg Intramuscular TID PRN Tex Drilling, NP       And   diphenhydrAMINE  (BENADRYL ) injection 50 mg  50 mg Intramuscular TID PRN Tex Drilling, NP       And   LORazepam  (ATIVAN ) injection 2 mg  2 mg Intramuscular TID PRN Tex Drilling, NP       diphenhydrAMINE  (BENADRYL ) injection 50 mg  50 mg Intramuscular TID PRN Tex Drilling, NP       And   LORazepam  (ATIVAN ) injection 2 mg  2 mg Intramuscular TID PRN Tex Drilling, NP       hydrOXYzine  (ATARAX ) tablet 25 mg  25 mg Oral TID PRN Tex Drilling, NP   25 mg at 09/29/24 0425   loperamide  (IMODIUM ) capsule 2-4 mg  2-4 mg Oral PRN Mannie Ashley SAILOR, MD       magnesium  hydroxide (MILK OF MAGNESIA) suspension 30 mL  30 mL Oral Daily PRN Lynnette Barter, MD       methocarbamol  (ROBAXIN ) tablet 500 mg  500 mg Oral Q8H PRN Stevens, Briana N, MD   500 mg at 09/29/24 0425   multivitamin with minerals tablet 1 tablet  1 tablet Oral Daily Mannie Ashley SAILOR, MD   1 tablet at  09/29/24 9075   naproxen  (NAPROSYN ) tablet 500 mg  500 mg Oral BID PRN Stevens, Briana N, MD   500 mg at 09/29/24 0425   nicotine  (NICODERM CQ  - dosed in mg/24 hours) patch 21 mg  21 mg Transdermal Daily Gottfried, Rhoda J, MD   21 mg at 09/29/24 9075   nicotine  polacrilex (NICORETTE ) gum 2 mg  2 mg Oral PRN Gottfried, Rhoda J, MD       ondansetron  (ZOFRAN -ODT) disintegrating tablet 4 mg  4 mg Oral Q6H PRN Mannie Ashley SAILOR, MD       traZODone  (DESYREL ) tablet 50 mg  50 mg Oral QHS PRN Lynnette Barter, MD       Current Outpatient Medications  Medication Sig Dispense Refill   Buprenorphine HCl-Naloxone HCl 4-1 MG FILM Place 1 Film under the tongue daily.     buPROPion  (WELLBUTRIN  XL) 300 MG 24 hr tablet Take 1 tablet (300 mg total) by mouth daily. 90 tablet 3   valACYclovir  (VALTREX ) 1000 MG tablet Take 1 tablet (1,000 mg total) by mouth daily. 90 tablet 3    Labs  Lab Results:  Admission on 09/27/2024, Discharged on 09/27/2024  Component Date Value Ref Range Status   WBC 09/27/2024 8.1  4.0 - 10.5 K/uL Final   RBC 09/27/2024 4.68  4.22 - 5.81 MIL/uL Final   Hemoglobin 09/27/2024 14.3  13.0 - 17.0 g/dL Final   HCT 98/94/7973 42.7  39.0 - 52.0 % Final   MCV 09/27/2024 91.2  80.0 - 100.0 fL Final   MCH 09/27/2024 30.6  26.0 - 34.0 pg Final   MCHC 09/27/2024 33.5  30.0 - 36.0 g/dL Final   RDW 98/94/7973 12.3  11.5 - 15.5 % Final   Platelets 09/27/2024 209  150 - 400 K/uL  Final   nRBC 09/27/2024 0.0  0.0 - 0.2 % Final   Performed at [facility], [address], [place], [place] [address]   Sodium 09/27/2024 139  135 - 145 mmol/L Final   Potassium 09/27/2024 4.5  3.5 - 5.1 mmol/L Final   Chloride 09/27/2024 102  98 - 111 mmol/L Final   CO2 09/27/2024 27  22 - 32 mmol/L Final   Glucose, Bld 09/27/2024 90  70 - 99 mg/dL Final   Glucose reference range applies only to samples taken after fasting for at least 8 hours.   BUN 09/27/2024 17  6 - 20 mg/dL Final   Creatinine, Ser 09/27/2024  0.94  0.61 - 1.24 mg/dL Final   Calcium 98/94/7973 9.3  8.9 - 10.3 mg/dL Final   Total Protein 98/94/7973 7.3  6.5 - 8.1 g/dL Final   Albumin 98/94/7973 4.9  3.5 - 5.0 g/dL Final   AST 98/94/7973 39  15 - 41 U/L Final   ALT 09/27/2024 40  0 - 44 U/L Final   Alkaline Phosphatase 09/27/2024 51  38 - 126 U/L Final   Total Bilirubin 09/27/2024 0.5  0.0 - 1.2 mg/dL Final   GFR, Estimated 09/27/2024 >60  >60 mL/min Final   Comment: (NOTE) Calculated using the CKD-EPI Creatinine Equation (2021)    Anion gap 09/27/2024 10  5 - 15 Final   Performed at [facility], [address], [place], [place] [address]   POC Amphetamine UR 09/27/2024 None Detected  NONE DETECTED (Cut Off Level 1000 ng/mL) Final   POC Secobarbital (BAR) 09/27/2024 None Detected  NONE DETECTED (Cut Off Level 300 ng/mL) Final   POC Buprenorphine (BUP) 09/27/2024 None Detected  NONE DETECTED (Cut Off Level 10 ng/mL) Final   POC Oxazepam (BZO) 09/27/2024 Positive (A)  NONE DETECTED (Cut Off Level 300 ng/mL) Final   POC Cocaine UR 09/27/2024 None Detected  NONE DETECTED (Cut Off Level 300 ng/mL) Final   POC Methamphetamine UR 09/27/2024 None Detected  NONE DETECTED (Cut Off Level 1000 ng/mL) Final   POC Morphine 09/27/2024 None Detected  NONE DETECTED (Cut Off Level 300 ng/mL) Final   POC Methadone UR 09/27/2024 None Detected  NONE DETECTED (Cut Off Level 300 ng/mL) Final   POC Oxycodone UR 09/27/2024 None Detected  NONE DETECTED (Cut Off Level 100 ng/mL) Final   POC Marijuana UR 09/27/2024 None Detected  NONE DETECTED (Cut Off Level 50 ng/mL) Final   Alcohol, Ethyl (B) 09/27/2024 <15  <15 mg/dL Final   Comment: (NOTE) For medical purposes only. Performed at [facility], [address], [place], [place] [address]   Office Visit on 05/05/2024  Component Date Value Ref Range Status   WBC 05/05/2024 6.9  4.0 - 10.5 K/uL Final   RBC 05/05/2024 4.37  4.22 - 5.81 Mil/uL Final   Platelets 05/05/2024 189.0  150.0 - 400.0  K/uL Final   Hemoglobin 05/05/2024 13.2  13.0 - 17.0 g/dL Final   HCT 91/86/7974 39.7  39.0 - 52.0 % Final   MCV 05/05/2024 90.9  78.0 - 100.0 fl Final   MCHC 05/05/2024 33.2  30.0 - 36.0 g/dL Final   RDW 91/86/7974 13.4  11.5 - 15.5 % Final   Sodium 05/05/2024 140  135 - 145 mEq/L Final   Potassium 05/05/2024 4.1  3.5 - 5.1 mEq/L Final   Chloride 05/05/2024 104  96 - 112 mEq/L Final   CO2 05/05/2024 30  19 - 32 mEq/L Final  Glucose, Bld 05/05/2024 87  70 - 99 mg/dL Final   BUN 91/86/7974 14  6 - 23 mg/dL Final   Creatinine, Ser 05/05/2024 1.02  0.40 - 1.50 mg/dL Final   Total Bilirubin 05/05/2024 0.5  0.2 - 1.2 mg/dL Final   Alkaline Phosphatase 05/05/2024 41  39 - 117 U/L Final   AST 05/05/2024 20  0 - 37 U/L Final   ALT 05/05/2024 17  0 - 53 U/L Final   Total Protein 05/05/2024 6.6  6.0 - 8.3 g/dL Final   Albumin 91/86/7974 4.6  3.5 - 5.2 g/dL Final   GFR 91/86/7974 93.32  >60.00 mL/min Final   Calculated using the CKD-EPI Creatinine Equation (2021)   Calcium 05/05/2024 9.1  8.4 - 10.5 mg/dL Final   Cholesterol 91/86/7974 158  0 - 200 mg/dL Final   ATP III Classification       Desirable:  < 200 mg/dL               Borderline High:  200 - 239 mg/dL          High:  > = 759 mg/dL   Triglycerides 91/86/7974 61.0  0.0 - 149.0 mg/dL Final   Normal:  <849 mg/dLBorderline High:  150 - 199 mg/dL   HDL 91/86/7974 40.09  >39.00 mg/dL Final   VLDL 91/86/7974 12.2  0.0 - 40.0 mg/dL Final   LDL Cholesterol 05/05/2024 86  0 - 99 mg/dL Final   Total CHOL/HDL Ratio 05/05/2024 3   Final                  Men          Women1/2 Average Risk     3.4          3.3Average Risk          5.0          4.42X Average Risk          9.6          7.13X Average Risk          15.0          11.0                       NonHDL 05/05/2024 97.95   Final   NOTE:  Non-HDL goal should be 30 mg/dL higher than patient's LDL goal (i.e. LDL goal of < 70 mg/dL, would have non-HDL goal of < 100 mg/dL)   TSH 91/86/7974 9.12  0.35  - 5.50 uIU/mL Final   Hgb A1c MFr Bld 05/05/2024 5.9  4.6 - 6.5 % Final   Glycemic Control Guidelines for People with Diabetes:Non Diabetic:  <6%Goal of Therapy: <7%Additional Action Suggested:  >8%     Blood Alcohol level:  Lab Results  Component Value Date   [facility] <15 09/27/2024    Metabolic Disorder Labs: Lab Results  Component Value Date   HGBA1C 5.9 05/05/2024   No results found for: PROLACTIN Lab Results  Component Value Date   CHOL 158 05/05/2024   TRIG 61.0 05/05/2024   HDL 59.90 05/05/2024   CHOLHDL 3 05/05/2024   VLDL 12.2 05/05/2024   LDLCALC 86 05/05/2024    Therapeutic Lab Levels: No results found for: LITHIUM No results found for: VALPROATE No results found for: CBMZ  Physical Findings   GAD-7    Flowsheet Row Office Visit from 05/05/2024 in [facility] [place] Office Visit from 09/10/2023 in  [facility]  Total GAD-7 Score 2 4   PHQ2-9    Flowsheet Row ED from 09/27/2024 in [facility] Office Visit from 05/05/2024 in [facility] [place] [facility] Office Visit from 09/10/2023 in [facility] [place]  PHQ-2 Total Score 2 1 2   PHQ-9 Total Score 9 5 5    Flowsheet Row ED from 09/27/2024 in [facility] Most recent reading at 09/28/2024 11:30 PM ED from 09/27/2024 in [facility] Most recent reading at 09/27/2024 11:08 AM  C-SSRS RISK CATEGORY No Risk No Risk     Musculoskeletal  Strength & Muscle Tone: within normal limits Gait & Station: normal Patient leans: N/A  Psychiatric Specialty Exam  Presentation  General Appearance:  Casual  Eye Contact: Fair  Speech: Clear and Coherent  Speech Volume: Normal  Handedness:Right   Mood and Affect  Mood: Depressed  Affect: Congruent   Thought Process  Thought Processes: Coherent  Descriptions of  Associations:Intact  Orientation:Full (Time, Place and Person)  Thought Content:Logical  Diagnosis of Schizophrenia or Schizoaffective disorder in past: No    Hallucinations:Hallucinations: None  Ideas of Reference:None  Suicidal Thoughts:Suicidal Thoughts: No  Homicidal Thoughts:Homicidal Thoughts: No   Sensorium  Memory: Immediate Fair  Judgment: Fair  Insight: Fair   Art Therapist  Concentration: Good  Attention Span: Good  Recall: Good  Fund of Knowledge: Good  Language: Good   Psychomotor Activity  Psychomotor Activity: Psychomotor Activity: Normal   Assets  Assets: Resilience; Social Support   Sleep  Sleep: Sleep: Fair  Estimated Sleeping Duration (Last 24 Hours): 13.00-14.50 hours  Nutritional Assessment (For OBS and [facility] admissions only) Has the patient had a weight loss or gain of 10 pounds or more in the last 3 months?: No Has the patient had a decrease in food intake/or appetite?: No Does the patient have dental problems?: No Does the patient have eating habits or behaviors that may be indicators of an eating disorder including binging or inducing vomiting?: No Has the patient recently lost weight without trying?: 0 Has the patient been eating poorly because of a decreased appetite?: 0 Malnutrition Screening Tool Score: 0    Physical Exam  Physical Exam Neurological:     General: No focal deficit present.     Mental Status: He is oriented to person, place, and time.  Psychiatric:        Behavior: Behavior normal.        Thought Content: Thought content normal.    Review of Systems  Psychiatric/Behavioral:  Positive for depression and substance abuse. Negative for hallucinations, memory loss and suicidal ideas. The patient is nervous/anxious and has insomnia.   All other systems reviewed and are negative.  Blood pressure 123/69, pulse 86, temperature 98.5 F (36.9 C), temperature source Oral, resp. rate 18, SpO2  98%. There is no height or weight on file to calculate BMI.  Treatment Plan Summary: Daily contact with patient to assess and evaluate symptoms and progress in treatment and Medication management  Safety and Monitoring: Voluntary admission to inpatient psychiatric unit for safety, stabilization and treatment Daily contact with patient to assess and evaluate symptoms and progress in treatment Patient's case to be discussed in multi-disciplinary team meeting Observation Level : q15 minute checks Vital signs: q12 hours Precautions: Safety  Long Term Goal(s): Improvement in symptoms so as ready for discharge  Short Term Goals: Ability to identify changes in  lifestyle to reduce recurrence of condition will improve, Ability to verbalize feelings will improve, Ability to disclose and discuss suicidal ideas, Ability to demonstrate self-control will improve, Ability to identify and develop effective coping behaviors will improve, Ability to maintain clinical measurements within normal limits will improve, Compliance with prescribed medications will improve, and Ability to identify triggers associated with substance abuse/mental health issues will improve  Diagnoses Principal Problem:   Opioid use disorder, moderate, dependence (HCC) Active Problems:   Polysubstance dependence (HCC)  1/7/20261:05 PM   Scheduled Meds:  chlordiazePOXIDE   25 mg Oral TID   Followed by   NOREEN ON 09/30/2024] chlordiazePOXIDE   25 mg Oral BH-qamhs   Followed by   NOREEN ON 10/01/2024] chlordiazePOXIDE   25 mg Oral Daily   multivitamin with minerals  1 tablet Oral Daily   nicotine   21 mg Transdermal Daily    PRN meds Current Facility-Administered Medications:    dicyclomine  (BENTYL ) tablet 20 mg, 20 mg, Oral, Q6H PRN   loperamide  (IMODIUM ) capsule 2-4 mg, 2-4 mg, Oral, PRN   methocarbamol  (ROBAXIN ) tablet 500 mg, 500 mg, Oral, Q8H PRN   naproxen  (NAPROSYN ) tablet 500 mg, 500 mg, Oral, BID PRN   ondansetron   (ZOFRAN -ODT) disintegrating tablet 4 mg, 4 mg, Oral, Q6H PRN   magnesium  hydroxide (MILK OF MAGNESIA) suspension 30 mL, 30 mL, Oral, Daily PRN   traZODone  (DESYREL ) tablet 50 mg, 50 mg, Oral, QHS PRN   acetaminophen  (TYLENOL ) tablet 650 mg, 650 mg, Oral, Q6H PRN, 650 mg at 09/28/24 0943   alum & mag hydroxide-simeth (MAALOX/MYLANTA) 200-200-20 MG/5ML suspension 30 mL, 30 mL, Oral, Q4H PRN   [DISCONTINUED] haloperidol  (HALDOL ) tablet 5 mg, 5 mg, Oral, TID PRN **AND** diphenhydrAMINE  (BENADRYL ) capsule 50 mg, 50 mg, Oral, TID PRN   haloperidol  lactate (HALDOL ) injection 5 mg, 5 mg, Intramuscular, TID PRN **AND** diphenhydrAMINE  (BENADRYL ) injection 50 mg, 50 mg, Intramuscular, TID PRN **AND** LORazepam  (ATIVAN ) injection 2 mg, 2 mg, Intramuscular, TID PRN   [DISCONTINUED] haloperidol  lactate (HALDOL ) injection 10 mg, 10 mg, Intramuscular, TID PRN **AND** diphenhydrAMINE  (BENADRYL ) injection 50 mg, 50 mg, Intramuscular, TID PRN **AND** LORazepam  (ATIVAN ) injection 2 mg, 2 mg, Intramuscular, TID PRN   hydrOXYzine  (ATARAX ) tablet 25 mg, 25 mg, Oral, TID PRN, 25 mg at 09/28/24 0940   nicotine  polacrilex (NICORETTE ) gum 2 mg, 2 mg, Oral, PRN   Discharge Planning: Social work and case management to assist with discharge planning and identification of hospital follow-up needs prior to discharge Estimated LOS: 3-5 days Discharge Concerns: Need to establish a safety plan; Medication compliance and effectiveness Discharge Goals: Return home with outpatient referrals for mental health follow-up including medication management/psychotherapy  I certify that inpatient services furnished can reasonably be expected to improve the patient's condition.   Donia Snell, NP Donia Snell, NP 09/29/2024 1:05 PM

## 2024-09-29 NOTE — Group Note (Signed)
 Group Topic: Understanding Self  Group Date: 09/29/2024 Start Time: 1200 End Time: 1230 Facilitators: Daved Tinnie HERO, RN  Department: [facility]  Number of Participants: 5  Group Focus: feeling awareness/expression Treatment Modality:  Psychoeducation Interventions utilized were patient education Purpose: increase insight  Name: Mark Bender Date of Birth: [birth year]  MR: [id number]    Level of Participation: moderate Quality of Participation: cooperative Interactions with others: gave feedback Mood/Affect: appropriate Triggers (if applicable): n/a Cognition: coherent/clear Progress: Gaining insight Response: to help manage emotions, pt says they will communicate his feelings Plan: patient will be encouraged to attend future RN education groups  Patients Problems:  Patient Active Problem List   Diagnosis Date Noted   Opioid use disorder, moderate, dependence (HCC) 09/27/2024   Polysubstance dependence (HCC) 09/27/2024   Encounter for screening and preventative care 05/05/2024   Mild obstructive sleep apnea 01/16/2024   Excessive daytime sleepiness 11/21/2023   Anemia 09/10/2023   History of herpes simplex type 2 infection 09/10/2023   Anxiety and depression 09/10/2023   Snoring 09/10/2023   Hep C w/o coma, chronic (HCC) 09/10/2023   Cannabis dependence, uncomplicated (HCC) 10/25/2022   Increased frequency of urination 05/02/2022

## 2024-09-29 NOTE — ED Notes (Signed)
 Pt presents as flat, cooperative, pleasant. Pt reports feeling 'good'. Pt denies si hi and avh, verbal contract for safety provided. Pt denies physical pain and discomforts, says he also ate breakfast. Medications reviewed, questions denied.

## 2024-09-29 NOTE — Care Management (Signed)
 [facility] Care Management...  Writer met with the client to discuss updates.  Client reports he wants to discharge to his sponsor and after he meets with his sponsor he can make a decision.  Client declined a phone interview with  [employer] due to him needing to verify FMLA.   Client is currently on a librium  taper and reports he wants to stay until he finishes with detox/taper.  Writer informed the client we will contact his sponsor, Lorrene, to verify this information.  Client reports he's also considering going to a [employer].   Mark Bender's phone number: [phone].  Writer will list information for  [facility] and [facility] in his AVS.

## 2024-09-29 NOTE — Group Note (Signed)
 Group Topic: Relapse and Recovery  Group Date: 09/29/2024 Start Time: 1100 End Time: 1200 Facilitators: Flint Hakeem, LCSW  Department: [facility]  Number of Participants: 1  Group Focus: anxiety, chemical dependency education, and coping skills Treatment Modality:  Art Therapy and Cognitive Behavioral Therapy Interventions utilized were patient education and problem solving Purpose: enhance coping skills, express feelings, express irrational fears, relapse prevention strategies, and trigger / craving management  Writer met with the client to discuss triggers, goals, and coping skills.  Writer explored with the client using art therapy to draw and depict things he wants to take with him into 2026 & things he would like to leave behind in 2025.  Client used art to explore these items.  Writer utilized CBT and MI to explore his goals and how substance use has impacted/hindered those goals.    Name: Mark Bender Date of Birth: 11/09/1985  MR: [id number]     Level of Participation:  Client did not attend group despite being encouraged to attend.  Quality of Participation:  NA Interactions with others: NA Mood/Affect: NA Triggers (if applicable): NA Cognition: NA Progress: NA Response: NA Plan:  Client will be encouraged to attend groups.    Patients Problems:  Patient Active Problem List   Diagnosis Date Noted   Opioid use disorder, moderate, dependence (HCC) 09/27/2024   Polysubstance dependence (HCC) 09/27/2024   Encounter for screening and preventative care 05/05/2024   Mild obstructive sleep apnea 01/16/2024   Excessive daytime sleepiness 11/21/2023   Anemia 09/10/2023   History of herpes simplex type 2 infection 09/10/2023   Anxiety and depression 09/10/2023   Snoring 09/10/2023   Hep C w/o coma, chronic (HCC) 09/10/2023   Cannabis dependence, uncomplicated (HCC) 10/25/2022   Increased frequency of urination 05/02/2022

## 2024-09-29 NOTE — ED Notes (Signed)
 Pt A&O x4, calm & cooperative at present.  No distress noted, monitoring for safety.

## 2024-09-29 NOTE — Care Management (Signed)
 [facility] Care Management...  Writer received verbal consent to speak with patients sponsor Lorrene [phone]  Writer spoke with Lorrene,  Per Lorrene, he will be able to pick patient up on Friday 10/01/2024 at 9 am  Patient will discharge Friday 10/01/2024 by 9:00 am  Patient will arrange transportation  Writer will provide inpatient and out patient resources  30 day Scripts

## 2024-09-30 DIAGNOSIS — F411 Generalized anxiety disorder: Secondary | ICD-10-CM | POA: Diagnosis not present

## 2024-09-30 DIAGNOSIS — F1120 Opioid dependence, uncomplicated: Secondary | ICD-10-CM | POA: Diagnosis not present

## 2024-09-30 DIAGNOSIS — F1320 Sedative, hypnotic or anxiolytic dependence, uncomplicated: Secondary | ICD-10-CM | POA: Diagnosis not present

## 2024-09-30 DIAGNOSIS — F32A Depression, unspecified: Secondary | ICD-10-CM | POA: Diagnosis not present

## 2024-09-30 MED ORDER — HYDROXYZINE HCL 25 MG PO TABS: 25.0000 mg | ORAL_TABLET | Freq: Three times a day (TID) | ORAL | 0 refills | Status: AC | PRN

## 2024-09-30 NOTE — Care Management (Signed)
 [facility] Care Management...  Patient will discharge Friday 10/01/2024 at 9:00 am  Patient will discharge to home.[address]  [place] [address]    Patient completed phone screening with [facility]  Patient follow-up with [facility] upon discharge  Patient will arrange transportation  30 day scripts

## 2024-09-30 NOTE — ED Notes (Signed)
 Patient is in the Dayroom calm and composed, watching TV with other patients. NAD. Respirations even and unlabored. Environment secured per policy. Will monitor for safety.

## 2024-09-30 NOTE — ED Notes (Signed)
 Patient A&Ox4. Denies intent to harm self/others when asked. Denies A/VH. Patient states, I feel like shit but that's normal for me. Easily agitated. Support and encouragement provided. Routine safety checks conducted according to facility protocol. Encouraged patient to notify staff if thoughts of harm toward self or others arise. Patient verbalize understanding and agreement. Will continue to monitor for safety.

## 2024-09-30 NOTE — Group Note (Signed)
 Group Topic: Emotional Regulation  Group Date: 09/30/2024 Start Time: 9251 End Time: 2018 Facilitators: Bryan Saddie CROME, [place]  Department: [facility]  Number of Participants: 8  Group Focus: anger management Treatment Modality:  Psychoeducation Interventions utilized were group exercise Purpose: enhance coping skills and express feelings  Name: Mark Bender Date of Birth: October 22, 1985  MR: [id number]    Level of Participation: minimal Quality of Participation: withdrawn Interactions with others: withdrawn Mood/Affect: flat Triggers (if applicable): N/A Cognition: no insight Progress: Minimal Response: N/A Plan: follow-up needed  Patients Problems:  Patient Active Problem List   Diagnosis Date Noted   Opioid use disorder, moderate, dependence (HCC) 09/27/2024   Polysubstance dependence (HCC) 09/27/2024   Encounter for screening and preventative care 05/05/2024   Mild obstructive sleep apnea 01/16/2024   Excessive daytime sleepiness 11/21/2023   Anemia 09/10/2023   History of herpes simplex type 2 infection 09/10/2023   Anxiety and depression 09/10/2023   Snoring 09/10/2023   Hep C w/o coma, chronic (HCC) 09/10/2023   Cannabis dependence, uncomplicated (HCC) 10/25/2022   Increased frequency of urination 05/02/2022

## 2024-09-30 NOTE — ED Notes (Signed)
 Pt on the phone. No acute distress noted. No concerns voiced. Informed pt to notify staff with any needs or assistance. Pt verbalized understanding and agreement. Will continue to monitor for safety.

## 2024-09-30 NOTE — ED Provider Notes (Addendum)
 Behavioral Health Progress Note  Date and Time: 09/30/2024 6:56 PM Name: Mark Bender MRN:  [id number]  Subjective:  Just want to get things organized to get out of her tomorrow  Diagnosis:  Final diagnoses:  Opioid type dependence, continuous abuse (HCC)  Benzodiazepine dependence (HCC)   Mark Bender is a 39 year-old-male with a past history of opioid use disorder, anxiety, and depression presenting at [facility] for help with detox. Patient reports that one month ago he relapsed and began using methadone, and two weeks ago also began using diazepam. Reports his most recent use was yesterday and he has been using daily. Prior to relapse, he had been on Suboxone 4 mg daily for the past 6 years, but began tapering off of it a few weeks ago because he wanted to use again. He also reports his had been keeping his Suboxone prescription a secret from his wife, but she found out about the prescription after seeing a paper insert from the prescription around their house. She reportedly gave him an ultimatum to stop the medication. Patient reports his wife also has a history of substance use and is in remission. Mark Bender, Mark SAILOR, MD, Date of Service: 09/27/2024  1:04 PM).   Assessment (09/30/24): On evaluation today, Mark Bender reports significant improvement in mood. He states that his depression is 0/10, indicating he is not experiencing depressive symptoms at this time. The patient reports improved sleep and appetite, noting he was able to sleep through the night for approximately 8 hours. He denies any withdrawal symptoms or medication side effects. Clinical monitoring today shows CIWA and COWS scores of 0. He reports continued engagement in daily activities and maintaining routines, including. He endorses improved concentration and energy levels, with no current suicidal or homicidal ideation, self-harm behaviors, or perceptual disturbances. He states that his support system has been  helpful, noting contact with family and friends as needed. Overall, he demonstrates insight into his symptoms and appears motivated to maintain stability and follow his treatment plan. During evaluation, Kendre Jacinto. Everard was engaged and cooperative, demonstrating clear and coherent thought processes. His speech was normal in rate and tone, with appropriate eye contact. Affect was euthymic and congruent with his reported mood, and he exhibited no signs of acute distress, agitation, or psychomotor abnormalities. He denied any homicidal ideation, self-harm behaviors, or perceptual disturbances. Throughout the assessment, he remained attentive, responsive, and appropriately interactive.  24 hr chart review: Sleep Hours last night: I slept good, through out the night, about 8 hrs Nursing Concerns: none reported/noted  Behavioral episodes in the past 24 hrs: none  Medication Compliance: Remains Compliant  Vital Signs in the past 24 hrs: Vital signs are within defined limits PRN Medications in the past 24 hrs:Hydroxyzine    Labs Reviewed -Toxicology screen: Positive for benzodiazepines  Plan: Based on the patients current presentation, including stable mood, absence of acute suicidal or homicidal ideation, ability to engage in safety planning, adherence to medications, and access to a structured support system, the patient is considered safe for discharge to home with outpatient follow-up and ongoing sobriety supports.  -Discharge Date/Time: Friday, 10/01/2024 at 9:00 AM  -Discharge Location: Home -- [address], [place], [place] [address]  -Safety and Sobriety Planning: Discussed plans to maintain sobriety post-discharge. He reports he will attend Narcotics Anonymous (NA), that his sponsor will be picking him up, and that he will return home to meet with his wife. Patient demonstrates understanding of ongoing recovery supports.  Follow-Up: The patient completed  a phone screening with  [facility] and will follow up with [facility] upon discharge. He is reports that he is able to arrange his own transportation home. A 30-day supply of all discharge medications will be provided on discharge.     Sleep: Good  Appetite:  Good  Current Medications:  Current Facility-Administered Medications  Medication Dose Route Frequency Provider Last Rate Last Admin   acetaminophen  (TYLENOL ) tablet 650 mg  650 mg Oral Q6H PRN Tex Drilling, NP   650 mg at 09/28/24 0943   alum & mag hydroxide-simeth (MAALOX/MYLANTA) 200-200-20 MG/5ML suspension 30 mL  30 mL Oral Q4H PRN Tex Drilling, NP       chlordiazePOXIDE  (LIBRIUM ) capsule 25 mg  25 mg Oral Letha Tex, Doris, NP   25 mg at 09/30/24 9072   Followed by   NOREEN ON 10/01/2024] chlordiazePOXIDE  (LIBRIUM ) capsule 25 mg  25 mg Oral Daily Nkwenti, Drilling, NP       dicyclomine  (BENTYL ) tablet 20 mg  20 mg Oral Q6H PRN Mannie Mark SAILOR, MD       diphenhydrAMINE  (BENADRYL ) capsule 50 mg  50 mg Oral TID PRN Tex Drilling, NP       haloperidol  lactate (HALDOL ) injection 5 mg  5 mg Intramuscular TID PRN Tex Drilling, NP       And   diphenhydrAMINE  (BENADRYL ) injection 50 mg  50 mg Intramuscular TID PRN Tex Drilling, NP       And   LORazepam  (ATIVAN ) injection 2 mg  2 mg Intramuscular TID PRN Tex Drilling, NP       diphenhydrAMINE  (BENADRYL ) injection 50 mg  50 mg Intramuscular TID PRN Tex Drilling, NP       And   LORazepam  (ATIVAN ) injection 2 mg  2 mg Intramuscular TID PRN Tex Drilling, NP       hydrOXYzine  (ATARAX ) tablet 25 mg  25 mg Oral TID PRN Tex Drilling, NP   25 mg at 09/29/24 2220   loperamide  (IMODIUM ) capsule 2-4 mg  2-4 mg Oral PRN Stevens, Briana N, MD       magnesium  hydroxide (MILK OF MAGNESIA) suspension 30 mL  30 mL Oral Daily PRN Lynnette Barter, MD       methocarbamol  (ROBAXIN ) tablet 500 mg  500 mg Oral Q8H PRN Stevens, Briana N, MD   500 mg at 09/29/24 0425   multivitamin with minerals tablet 1 tablet  1 tablet Oral Daily  Mannie Mark SAILOR, MD   1 tablet at 09/30/24 9072   naproxen  (NAPROSYN ) tablet 500 mg  500 mg Oral BID PRN Stevens, Briana N, MD   500 mg at 09/29/24 0425   nicotine  (NICODERM CQ  - dosed in mg/24 hours) patch 21 mg  21 mg Transdermal Daily Gottfried, Rhoda J, MD   21 mg at 09/30/24 9072   nicotine  polacrilex (NICORETTE ) gum 2 mg  2 mg Oral PRN Gottfried, Rhoda J, MD       ondansetron  (ZOFRAN -ODT) disintegrating tablet 4 mg  4 mg Oral Q6H PRN Mannie Mark SAILOR, MD       traZODone  (DESYREL ) tablet 50 mg  50 mg Oral QHS PRN Lynnette Barter, MD       Current Outpatient Medications  Medication Sig Dispense Refill   Buprenorphine HCl-Naloxone HCl 4-1 MG FILM Place 1 Film under the tongue daily.     buPROPion  (WELLBUTRIN  XL) 300 MG 24 hr tablet Take 1 tablet (300 mg total) by mouth daily. 90 tablet 3   hydrOXYzine  (ATARAX ) 25 MG tablet  Take 1 tablet (25 mg total) by mouth 3 (three) times daily as needed. 30 tablet 0   valACYclovir  (VALTREX ) 1000 MG tablet Take 1 tablet (1,000 mg total) by mouth daily. 90 tablet 3    Labs  Lab Results:  Admission on 09/27/2024, Discharged on 09/27/2024  Component Date Value Ref Range Status   WBC 09/27/2024 8.1  4.0 - 10.5 K/uL Final   RBC 09/27/2024 4.68  4.22 - 5.81 MIL/uL Final   Hemoglobin 09/27/2024 14.3  13.0 - 17.0 g/dL Final   HCT 98/94/7973 42.7  39.0 - 52.0 % Final   MCV 09/27/2024 91.2  80.0 - 100.0 fL Final   MCH 09/27/2024 30.6  26.0 - 34.0 pg Final   MCHC 09/27/2024 33.5  30.0 - 36.0 g/dL Final   RDW 98/94/7973 12.3  11.5 - 15.5 % Final   Platelets 09/27/2024 209  150 - 400 K/uL Final   nRBC 09/27/2024 0.0  0.0 - 0.2 % Final   Performed at [facility], [address] [place], [place] [address]   Sodium 09/27/2024 139  135 - 145 mmol/L Final   Potassium 09/27/2024 4.5  3.5 - 5.1 mmol/L Final   Chloride 09/27/2024 102  98 - 111 mmol/L Final   CO2 09/27/2024 27  22 - 32 mmol/L Final   Glucose, Bld 09/27/2024 90  70 - 99 mg/dL Final   Glucose  reference range applies only to samples taken after fasting for at least 8 hours.   BUN 09/27/2024 17  6 - 20 mg/dL Final   Creatinine, Ser 09/27/2024 0.94  0.61 - 1.24 mg/dL Final   Calcium 98/94/7973 9.3  8.9 - 10.3 mg/dL Final   Total Protein 98/94/7973 7.3  6.5 - 8.1 g/dL Final   Albumin 98/94/7973 4.9  3.5 - 5.0 g/dL Final   AST 98/94/7973 39  15 - 41 U/L Final   ALT 09/27/2024 40  0 - 44 U/L Final   Alkaline Phosphatase 09/27/2024 51  38 - 126 U/L Final   Total Bilirubin 09/27/2024 0.5  0.0 - 1.2 mg/dL Final   GFR, Estimated 09/27/2024 >60  >60 mL/min Final   Comment: (NOTE) Calculated using the CKD-EPI Creatinine Equation (2021)    Anion gap 09/27/2024 10  5 - 15 Final   Performed at [facility], [address] [place], [place] [address]   POC Amphetamine UR 09/27/2024 None Detected  NONE DETECTED (Cut Off Level 1000 ng/mL) Final   POC Secobarbital (BAR) 09/27/2024 None Detected  NONE DETECTED (Cut Off Level 300 ng/mL) Final   POC Buprenorphine (BUP) 09/27/2024 None Detected  NONE DETECTED (Cut Off Level 10 ng/mL) Final   POC Oxazepam (BZO) 09/27/2024 Positive (A)  NONE DETECTED (Cut Off Level 300 ng/mL) Final   POC Cocaine UR 09/27/2024 None Detected  NONE DETECTED (Cut Off Level 300 ng/mL) Final   POC Methamphetamine UR 09/27/2024 None Detected  NONE DETECTED (Cut Off Level 1000 ng/mL) Final   POC Morphine 09/27/2024 None Detected  NONE DETECTED (Cut Off Level 300 ng/mL) Final   POC Methadone UR 09/27/2024 None Detected  NONE DETECTED (Cut Off Level 300 ng/mL) Final   POC Oxycodone UR 09/27/2024 None Detected  NONE DETECTED (Cut Off Level 100 ng/mL) Final   POC Marijuana UR 09/27/2024 None Detected  NONE DETECTED (Cut Off Level 50 ng/mL) Final   Alcohol, Ethyl (B) 09/27/2024 <15  <15 mg/dL Final   Comment: (NOTE) For medical purposes only. Performed at [facility]  Lab, [address], [place], [place] [address]   Office Visit on 05/05/2024  Component Date Value  Ref Range Status   WBC 05/05/2024 6.9  4.0 - 10.5 K/uL Final   RBC 05/05/2024 4.37  4.22 - 5.81 Mil/uL Final   Platelets 05/05/2024 189.0  150.0 - 400.0 K/uL Final   Hemoglobin 05/05/2024 13.2  13.0 - 17.0 g/dL Final   HCT 91/86/7974 39.7  39.0 - 52.0 % Final   MCV 05/05/2024 90.9  78.0 - 100.0 fl Final   MCHC 05/05/2024 33.2  30.0 - 36.0 g/dL Final   RDW 91/86/7974 13.4  11.5 - 15.5 % Final   Sodium 05/05/2024 140  135 - 145 mEq/L Final   Potassium 05/05/2024 4.1  3.5 - 5.1 mEq/L Final   Chloride 05/05/2024 104  96 - 112 mEq/L Final   CO2 05/05/2024 30  19 - 32 mEq/L Final   Glucose, Bld 05/05/2024 87  70 - 99 mg/dL Final   BUN 91/86/7974 14  6 - 23 mg/dL Final   Creatinine, Ser 05/05/2024 1.02  0.40 - 1.50 mg/dL Final   Total Bilirubin 05/05/2024 0.5  0.2 - 1.2 mg/dL Final   Alkaline Phosphatase 05/05/2024 41  39 - 117 U/L Final   AST 05/05/2024 20  0 - 37 U/L Final   ALT 05/05/2024 17  0 - 53 U/L Final   Total Protein 05/05/2024 6.6  6.0 - 8.3 g/dL Final   Albumin 91/86/7974 4.6  3.5 - 5.2 g/dL Final   GFR 91/86/7974 93.32  >60.00 mL/min Final   Calculated using the CKD-EPI Creatinine Equation (2021)   Calcium 05/05/2024 9.1  8.4 - 10.5 mg/dL Final   Cholesterol 91/86/7974 158  0 - 200 mg/dL Final   ATP III Classification       Desirable:  < 200 mg/dL               Borderline High:  200 - 239 mg/dL          High:  > = 759 mg/dL   Triglycerides 91/86/7974 61.0  0.0 - 149.0 mg/dL Final   Normal:  <849 mg/dLBorderline High:  150 - 199 mg/dL   HDL 91/86/7974 40.09  >39.00 mg/dL Final   VLDL 91/86/7974 12.2  0.0 - 40.0 mg/dL Final   LDL Cholesterol 05/05/2024 86  0 - 99 mg/dL Final   Total CHOL/HDL Ratio 05/05/2024 3   Final                  Men          Women1/2 Average Risk     3.4          3.3Average Risk          5.0          4.42X Average Risk          9.6          7.13X Average Risk          15.0          11.0                       NonHDL 05/05/2024 97.95   Final   NOTE:   Non-HDL goal should be 30 mg/dL higher than patient's LDL goal (i.e. LDL goal of < 70 mg/dL, would have non-HDL goal of < 100 mg/dL)   TSH 91/86/7974 9.12  0.35 - 5.50 uIU/mL Final   Hgb A1c MFr  Bld 05/05/2024 5.9  4.6 - 6.5 % Final   Glycemic Control Guidelines for People with Diabetes:Non Diabetic:  <6%Goal of Therapy: <7%Additional Action Suggested:  >8%     Blood Alcohol level:  Lab Results  Component Value Date   [facility] <15 09/27/2024    Metabolic Disorder Labs: Lab Results  Component Value Date   HGBA1C 5.9 05/05/2024   No results found for: PROLACTIN Lab Results  Component Value Date   CHOL 158 05/05/2024   TRIG 61.0 05/05/2024   HDL 59.90 05/05/2024   CHOLHDL 3 05/05/2024   VLDL 12.2 05/05/2024   LDLCALC 86 05/05/2024    Therapeutic Lab Levels: No results found for: LITHIUM No results found for: VALPROATE No results found for: CBMZ  Physical Findings   GAD-7    Flowsheet Row Office Visit from 05/05/2024 in [facility] Office Visit from 09/10/2023 in [facility]  Total GAD-7 Score 2 4   PHQ2-9    Flowsheet Row ED from 09/27/2024 in [facility] Office Visit from 05/05/2024 in [facility] Office Visit from 09/10/2023 in [facility] [place]  PHQ-2 Total Score 2 1 2   PHQ-9 Total Score 9 5 5    Flowsheet Row ED from 09/27/2024 in [facility] Most recent reading at 09/28/2024 11:30 PM ED from 09/27/2024 in [facility] Most recent reading at 09/27/2024 11:08 AM  C-SSRS RISK CATEGORY No Risk No Risk     Musculoskeletal  Strength & Muscle Tone: within normal limits Gait & Station: normal Patient leans: N/A  Psychiatric Specialty Exam  Presentation  General Appearance:  Appropriate for Environment  Eye Contact: Good  Speech: Clear and  Coherent  Speech Volume: Normal  Handedness: Right   Mood and Affect  Mood: Euthymic  Affect: Congruent   Thought Process  Thought Processes: Coherent  Descriptions of Associations:Intact  Orientation:Full (Time, Place and Person)  Thought Content:Logical  Diagnosis of Schizophrenia or Schizoaffective disorder in past: No    Hallucinations:Hallucinations: None  Ideas of Reference:None  Suicidal Thoughts:Suicidal Thoughts: No  Homicidal Thoughts:Homicidal Thoughts: No   Sensorium  Memory: Immediate Good  Judgment: Fair  Insight: Fair   Art Therapist  Concentration: Good  Attention Span: Good  Recall: Good  Fund of Knowledge: Good  Language: Good   Psychomotor Activity  Psychomotor Activity: Psychomotor Activity: Normal   Assets  Assets: Communication Skills; Resilience; Desire for Improvement; Social Support   Sleep  Sleep: Sleep: Good  Estimated Sleeping Duration (Last 24 Hours): 8.00-10.75 hours  No data recorded  Physical Exam  Physical Exam Vitals and nursing note reviewed.  Cardiovascular:     Rate and Rhythm: Normal rate.  Pulmonary:     Effort: Pulmonary effort is normal.  Psychiatric:        Attention and Perception: Attention normal.        Mood and Affect: Mood normal.        Speech: Speech normal.        Behavior: Behavior is cooperative.        Thought Content: Thought content does not include homicidal or suicidal ideation.        Cognition and Memory: Memory normal.    Review of Systems  Psychiatric/Behavioral:  Positive for substance abuse. Negative for depression, hallucinations, memory loss and suicidal ideas. The patient is not nervous/anxious and does not have insomnia.  Blood pressure 99/64, pulse 64, temperature 98.5 F (36.9 C), temperature source Oral, resp. rate 16, SpO2 99%. There is no height or weight on file to calculate BMI.  Treatment Plan Summary: Daily contact with patient  to assess and evaluate symptoms and progress in treatment.  Based on the patients current presentation, including stable mood, absence of acute suicidal or homicidal ideation, ability to engage in safety planning, adherence to medications, and access to a structured support system, the patient is considered safe for discharge to home with outpatient follow-up and ongoing sobriety supports.  -Discharge Date/Time: Friday, 10/01/2024 at 9:00 AM  -Discharge Location: Home -- [address], [place], [place] [address]  -Safety and Sobriety Planning: Discussed plans to maintain sobriety post-discharge. He reports he will attend Narcotics Anonymous (NA), that his sponsor will be picking him up, and that he will return home to meet with his wife. Patient demonstrates understanding of ongoing recovery supports.  Follow-Up: The patient completed a phone screening with [facility] and will follow up with [facility] upon discharge. He is reports that he is able to arrange his own transportation home. A 30-day supply of all discharge medications has been provided.    Ardelle JONELLE Blush, NP 09/30/2024 6:56 PM

## 2024-09-30 NOTE — Care Management (Signed)
 [facility] Care Management...  Writer called [facility] to check for updates.  [facility] reports the client needs to call them to complete a phone assessment.  Writer provided the client with [facility]'s phone number and informed him to call ASAP.

## 2024-09-30 NOTE — ED Notes (Addendum)
 Patient asleep at this time. NAD. COWS and CIWA assessment could not be completed. Will keep monitoring patient for safety.

## 2024-09-30 NOTE — ED Notes (Signed)
 Pt sleeping at present, no distress noted at present.  Monitoring for safety.

## 2024-09-30 NOTE — Care Management (Signed)
" °  [facility] Care Management...   Writer called [facility] to check on the status of the client's referral.  [facility] reports the client has been accepted into their program on 10/07/23.  [facility] is asking for the client to be at their facility by 9am on 10/07/23.     Client reports he will also notify his sponsor.  Writer informed the client this is the earliest appointment slot they have as of now.  "

## 2024-09-30 NOTE — ED Notes (Signed)
 Pt sleeping in no acute distress. RR even and unlabored. Environment secured. Will continue to monitor for safety.

## 2024-09-30 NOTE — Group Note (Signed)
 Group Topic: Communication  Group Date: 09/30/2024 Start Time: 1705 End Time: 1730 Facilitators: Herold Lajuana NOVAK, RN  Department: [facility]  Number of Participants: 10  Group Focus: other unit rules  Treatment Modality:  Individual Therapy Interventions utilized were clarification Purpose: increase insight  Name: Mark Bender Date of Birth: 07/15/1986  MR: [id number]    Level of Participation: active Quality of Participation: attentive Interactions with others: gave feedback Mood/Affect: appropriate Triggers (if applicable): none identified Cognition: coherent/clear Progress: Gaining insight Response: Pt verbalized understanding of rules of unit Plan: patient will be encouraged to remain tx compliant to rules of unit  Patients Problems:  Patient Active Problem List   Diagnosis Date Noted   Opioid use disorder, moderate, dependence (HCC) 09/27/2024   Polysubstance dependence (HCC) 09/27/2024   Encounter for screening and preventative care 05/05/2024   Mild obstructive sleep apnea 01/16/2024   Excessive daytime sleepiness 11/21/2023   Anemia 09/10/2023   History of herpes simplex type 2 infection 09/10/2023   Anxiety and depression 09/10/2023   Snoring 09/10/2023   Hep C w/o coma, chronic (HCC) 09/10/2023   Cannabis dependence, uncomplicated (HCC) 10/25/2022   Increased frequency of urination 05/02/2022

## 2024-09-30 NOTE — Group Note (Signed)
 Date:  09/30/2024 Time:  3:14 PM  Group Topic/Focus:  Building Self Esteem:   The Focus of this group is helping patients become aware of the effects of self-esteem on their lives, the things they and others do that enhance or undermine their self-esteem, seeing the relationship between their level of self-esteem and the choices they make and learning ways to enhance self-esteem. Conflict Resolution:   The focus of this group is to discuss the conflict resolution process and how it may be used upon discharge. Goals Group:   The focus of this group is to help patients establish daily goals to achieve during treatment and discuss how the patient can incorporate goal setting into their daily lives to aide in recovery. Self Care:   The focus of this group is to help patients understand the importance of self-care in order to improve or restore emotional, physical, spiritual, interpersonal, and financial health.    Participation Level:  Active  Participation Quality:  Attentive  Affect:  Appropriate  Cognitive:  Appropriate  Insight: Appropriate  Engagement in Group:  Distracting  Modes of Intervention:  Discussion  Additional Comments:  I was able to do an individual interview with patient. Patient was positive and open to thinking outside the box with future goals.   Chrisha Vogel T Jay Kempe 09/30/2024, 3:14 PM

## 2024-09-30 NOTE — Group Note (Signed)
 Group Topic: Recovery Basics  Group Date: 09/30/2024 Start Time: 8069 End Time: 2000 Facilitators: Verdon Jacqualyn BRAVO, NT  Department: [facility]  Number of Participants: 8  Group Focus: relapse prevention Treatment Modality:  Individual Therapy Interventions utilized were group exercise Purpose: relapse prevention strategies  Name: Mark Bender Date of Birth: 09/13/1986  MR: [id number]    Level of Participation: active Quality of Participation: cooperative Interactions with others: gave feedback Mood/Affect: appropriate Triggers (if applicable): n/a Cognition: coherent/clear Progress: Moderate Response: n/a Plan: follow-up needed  Patients Problems:  Patient Active Problem List   Diagnosis Date Noted   Opioid use disorder, moderate, dependence (HCC) 09/27/2024   Polysubstance dependence (HCC) 09/27/2024   Encounter for screening and preventative care 05/05/2024   Mild obstructive sleep apnea 01/16/2024   Excessive daytime sleepiness 11/21/2023   Anemia 09/10/2023   History of herpes simplex type 2 infection 09/10/2023   Anxiety and depression 09/10/2023   Snoring 09/10/2023   Hep C w/o coma, chronic (HCC) 09/10/2023   Cannabis dependence, uncomplicated (HCC) 10/25/2022   Increased frequency of urination 05/02/2022

## 2024-09-30 NOTE — ED Notes (Signed)
 Pt sleeping at present, no distress noted.  Monitoring for safety.

## 2024-10-01 DIAGNOSIS — F411 Generalized anxiety disorder: Secondary | ICD-10-CM | POA: Diagnosis not present

## 2024-10-01 DIAGNOSIS — F1120 Opioid dependence, uncomplicated: Secondary | ICD-10-CM | POA: Diagnosis not present

## 2024-10-01 DIAGNOSIS — F1320 Sedative, hypnotic or anxiolytic dependence, uncomplicated: Secondary | ICD-10-CM | POA: Diagnosis not present

## 2024-10-01 DIAGNOSIS — F32A Depression, unspecified: Secondary | ICD-10-CM | POA: Diagnosis not present

## 2024-10-01 NOTE — ED Notes (Signed)
 Pt says he feels 'good' and is ready for discharge. Pt denies si hi and avh, verbal contract for safety provided. Pt denies physical pain and discomforts. Medications reviewed, questions denied.

## 2024-10-01 NOTE — ED Notes (Signed)
 Discharge paperwork discussed with pt, questions denied. Pt escorted off unit by staff, items returned.

## 2024-10-01 NOTE — ED Notes (Signed)
 Patient is sleeping in bedroom. NAD

## 2024-10-01 NOTE — Care Management (Signed)
 [facility] Care Management...  Patient will discharge Friday 10/01/2024 by 9:00 am   Per Joen @ [facility],  Patient  has been accepted at [facility], appointment scheduled for Wednesday 10/06/2024 @ 9:00 am. Patient will follow-up with [facility], after discharging  [facility] [address], [place], [place] [address] Phone: [phone]   Patient will arrange transportation  Writer will provide inpatient and out patient resources  30 day Scripts

## 2024-10-01 NOTE — Group Note (Signed)
 Group Topic: Understanding Self  Group Date: 09/30/2024 Start Time: 1000 End Time: 1050 Facilitators: Gerome Jolly, NT  Department: [facility]  Number of Participants: 5  Group Focus: acceptance and chemical dependency education NA IP #9 Self-acceptance   Treatment Modality:  Cognitive Behavioral Therapy, Psychoeducation, and Spiritual Interventions utilized were exploration Purpose: explore maladaptive thinking, relapse prevention strategies, and to gain understanding of the lack of self-acceptance in recovery    Name: Mark Bender Date of Birth: March 03, 1986  MR: [id number]    Level of Participation: active Quality of Participation: attentive, cooperative, and engaged Interactions with others: gave feedback Mood/Affect: appropriate Triggers (if applicable): na Cognition: fearful and guilty Progress: Minimal Response: Pt did not say too much during group. However, patient actively listened  Plan: changes to discharge plan, patient will discharge to home. Patient sponsor will pick up    Patients Problems:  Patient Active Problem List   Diagnosis Date Noted   Opioid use disorder, moderate, dependence (HCC) 09/27/2024   Polysubstance dependence (HCC) 09/27/2024   Encounter for screening and preventative care 05/05/2024   Mild obstructive sleep apnea 01/16/2024   Excessive daytime sleepiness 11/21/2023   Anemia 09/10/2023   History of herpes simplex type 2 infection 09/10/2023   Anxiety and depression 09/10/2023   Snoring 09/10/2023   Hep C w/o coma, chronic (HCC) 09/10/2023   Cannabis dependence, uncomplicated (HCC) 10/25/2022   Increased frequency of urination 05/02/2022

## 2024-10-01 NOTE — ED Provider Notes (Signed)
 [facility]/OBS ASAP Discharge Summary  Date and Time: 10/01/2024 2:07 PM  Name: Mark Bender  MRN:  [id number]   Discharge Diagnoses:  Final diagnoses:  Opioid type dependence, continuous abuse (HCC)  Benzodiazepine dependence (HCC)    Subjective: Today the patient is alert and oriented 4, with linear, coherent, and goal-directed thought processes. Mood is euthymic with appropriate affect. Speech is clear and organized. He denies SI, HI, and AVH. Judgment and insight are fair to good. He is engaged in discharge planning and demonstrates understanding of follow-up recommendations.  Stay Summary: Mark Bender is a 39 year old male with a history of opioid use disorder, anxiety, and depression who presented to [facility] requesting assistance with detoxification. He reported a relapse approximately one month prior, with daily use of methadone, and additional use of diazepam beginning two weeks prior to presentation. His last reported use was the day prior to admission. Prior to relapse, he had been maintained on buprenorphine/naloxone 4 mg daily for approximately six years, which he had recently begun tapering in the context of relapse. Psychosocial stressors, including marital strain and relationship conflict, were identified as contributing factors.  The patient was admitted to the Facility-Based Crisis unit for medically supervised detoxification and monitoring. His length of stay was approximately 93 hours. Throughout admission, he remained medically stable, with no evidence of acute withdrawal, toxicity, or complications. He did not require escalation of care. Vital signs remained stable, and detoxification was completed without incident.  During hospitalization, the patient was cooperative, engaged, and participatory in treatment planning. He demonstrated improving insight into the role of substance use in his psychosocial stressors and articulated motivation for recovery.  He denied suicidal ideation, homicidal ideation, and auditory or visual hallucinations throughout his stay. There was no evidence of acute psychiatric decompensation.  At the time of discharge, the patient is psychiatrically stable, reports no withdrawal symptoms, and shows no signs of intoxication or toxicity. He is discharging with a sponsor and has a clear plan for continued recovery support.  Total Time spent with patient: 1 hour  Past Psychiatric History: polysubstance abuse, MDD, GAD  Past Medical History:  Family History:  Family Psychiatric History:  Social History:  Tobacco Cessation:  N/A, patient does not currently use tobacco products  Current Medications:  Current Facility-Administered Medications  Medication Dose Route Frequency Provider Last Rate Last Admin   acetaminophen  (TYLENOL ) tablet 650 mg  650 mg Oral Q6H PRN Tex Drilling, NP   650 mg at 09/28/24 0943   alum & mag hydroxide-simeth (MAALOX/MYLANTA) 200-200-20 MG/5ML suspension 30 mL  30 mL Oral Q4H PRN Tex Drilling, NP       dicyclomine  (BENTYL ) tablet 20 mg  20 mg Oral Q6H PRN Mannie Ashley SAILOR, MD       diphenhydrAMINE  (BENADRYL ) capsule 50 mg  50 mg Oral TID PRN Tex Drilling, NP       haloperidol  lactate (HALDOL ) injection 5 mg  5 mg Intramuscular TID PRN Tex Drilling, NP       And   diphenhydrAMINE  (BENADRYL ) injection 50 mg  50 mg Intramuscular TID PRN Tex Drilling, NP       And   LORazepam  (ATIVAN ) injection 2 mg  2 mg Intramuscular TID PRN Tex Drilling, NP       diphenhydrAMINE  (BENADRYL ) injection 50 mg  50 mg Intramuscular TID PRN Tex Drilling, NP       And   LORazepam  (ATIVAN ) injection 2 mg  2 mg Intramuscular TID PRN Tex Drilling, NP  hydrOXYzine  (ATARAX ) tablet 25 mg  25 mg Oral TID PRN Tex Drilling, NP   25 mg at 09/30/24 2118   loperamide  (IMODIUM ) capsule 2-4 mg  2-4 mg Oral PRN Mannie Ashley SAILOR, MD       magnesium  hydroxide (MILK OF MAGNESIA) suspension 30 mL  30 mL Oral  Daily PRN Lynnette Barter, MD       methocarbamol  (ROBAXIN ) tablet 500 mg  500 mg Oral Q8H PRN Stevens, Briana N, MD   500 mg at 09/29/24 0425   multivitamin with minerals tablet 1 tablet  1 tablet Oral Daily Mannie Ashley SAILOR, MD   1 tablet at 10/01/24 0831   naproxen  (NAPROSYN ) tablet 500 mg  500 mg Oral BID PRN Stevens, Briana N, MD   500 mg at 09/29/24 0425   nicotine  (NICODERM CQ  - dosed in mg/24 hours) patch 21 mg  21 mg Transdermal Daily Gottfried, Rhoda J, MD   21 mg at 10/01/24 9166   nicotine  polacrilex (NICORETTE ) gum 2 mg  2 mg Oral PRN Gottfried, Rhoda J, MD       ondansetron  (ZOFRAN -ODT) disintegrating tablet 4 mg  4 mg Oral Q6H PRN Mannie Ashley SAILOR, MD       traZODone  (DESYREL ) tablet 50 mg  50 mg Oral QHS PRN Lynnette Barter, MD   50 mg at 09/30/24 2118   Current Outpatient Medications  Medication Sig Dispense Refill   hydrOXYzine  (ATARAX ) 25 MG tablet Take 1 tablet (25 mg total) by mouth 3 (three) times daily as needed. 30 tablet 0   valACYclovir  (VALTREX ) 1000 MG tablet Take 1 tablet (1,000 mg total) by mouth daily. 90 tablet 3    PTA Medications:  PTA Medications  Medication Sig   valACYclovir  (VALTREX ) 1000 MG tablet Take 1 tablet (1,000 mg total) by mouth daily.   hydrOXYzine  (ATARAX ) 25 MG tablet Take 1 tablet (25 mg total) by mouth 3 (three) times daily as needed.   Facility Ordered Medications  Medication   [COMPLETED] thiamine  (VITAMIN B1) injection 100 mg   multivitamin with minerals tablet 1 tablet   dicyclomine  (BENTYL ) tablet 20 mg   loperamide  (IMODIUM ) capsule 2-4 mg   methocarbamol  (ROBAXIN ) tablet 500 mg   naproxen  (NAPROSYN ) tablet 500 mg   ondansetron  (ZOFRAN -ODT) disintegrating tablet 4 mg   magnesium  hydroxide (MILK OF MAGNESIA) suspension 30 mL   traZODone  (DESYREL ) tablet 50 mg   acetaminophen  (TYLENOL ) tablet 650 mg   alum & mag hydroxide-simeth (MAALOX/MYLANTA) 200-200-20 MG/5ML suspension 30 mL   diphenhydrAMINE  (BENADRYL ) capsule 50 mg    haloperidol  lactate (HALDOL ) injection 5 mg   And   diphenhydrAMINE  (BENADRYL ) injection 50 mg   And   LORazepam  (ATIVAN ) injection 2 mg   diphenhydrAMINE  (BENADRYL ) injection 50 mg   And   LORazepam  (ATIVAN ) injection 2 mg   hydrOXYzine  (ATARAX ) tablet 25 mg   nicotine  (NICODERM CQ  - dosed in mg/24 hours) patch 21 mg   nicotine  polacrilex (NICORETTE ) gum 2 mg   [COMPLETED] thiamine  (VITAMIN B1) injection 100 mg   [EXPIRED] chlordiazePOXIDE  (LIBRIUM ) capsule 25 mg   Followed by   [COMPLETED] chlordiazePOXIDE  (LIBRIUM ) capsule 25 mg   Followed by   [COMPLETED] chlordiazePOXIDE  (LIBRIUM ) capsule 25 mg   Followed by   [COMPLETED] chlordiazePOXIDE  (LIBRIUM ) capsule 25 mg       10/01/2024    2:07 PM 09/30/2024    3:30 PM 09/28/2024   11:40 AM  Depression screen PHQ 2/9  Decreased Interest 0 1 1  Down, Depressed,  Hopeless 0 1 1  PHQ - 2 Score 0 2 2  Altered sleeping  1 1  Tired, decreased energy  1 1  Change in appetite  1 1  Feeling bad or failure about yourself   1 1  Trouble concentrating  1 1  Moving slowly or fidgety/restless  1 1  Suicidal thoughts  1 1  PHQ-9 Score  9 9  Difficult doing work/chores Not difficult at all Somewhat difficult Somewhat difficult    Flowsheet Row ED from 09/27/2024 in [facility] Most recent reading at 09/28/2024 11:30 PM ED from 09/27/2024 in [facility] Most recent reading at 09/27/2024 11:08 AM  C-SSRS RISK CATEGORY No Risk No Risk    Musculoskeletal  Strength & Muscle Tone: within normal limits Gait & Station: normal Patient leans: N/A  Psychiatric Specialty Exam  Presentation  General Appearance:  Appropriate for Environment  Eye Contact: Good  Speech: Clear and Coherent  Speech Volume: Normal  Handedness: Right   Mood and Affect  Mood: Euthymic  Affect: Congruent   Thought Process  Thought Processes: Coherent  Descriptions of  Associations:Intact  Orientation:Full (Time, Place and Person)  Thought Content:Logical  Diagnosis of Schizophrenia or Schizoaffective disorder in past: No    Hallucinations:Hallucinations: None  Ideas of Reference:None  Suicidal Thoughts:Suicidal Thoughts: No  Homicidal Thoughts:Homicidal Thoughts: No   Sensorium  Memory: Immediate Good  Judgment: Fair  Insight: Fair   Art Therapist  Concentration: Good  Attention Span: Good  Recall: Good  Fund of Knowledge: Good  Language: Good   Psychomotor Activity  Psychomotor Activity: Psychomotor Activity: Normal   Assets  Assets: Communication Skills; Resilience; Desire for Improvement; Social Support   Sleep  Sleep: Sleep: Good  Estimated Sleeping Duration (Last 24 Hours): 9.25-11.50 hours  No data recorded  Physical Exam  Physical Exam Vitals and nursing note reviewed.  Constitutional:      Appearance: Normal appearance. He is normal weight.  Neurological:     General: No focal deficit present.     Mental Status: He is alert and oriented to person, place, and time. Mental status is at baseline.  Psychiatric:        Mood and Affect: Mood normal.        Behavior: Behavior normal.        Thought Content: Thought content normal.        Judgment: Judgment normal.    Review of Systems  Psychiatric/Behavioral: Negative.    All other systems reviewed and are negative.  Blood pressure 102/64, pulse 71, temperature 97.8 F (36.6 C), temperature source Oral, resp. rate 18, SpO2 96%. There is no height or weight on file to calculate BMI.  Demographic Factors:  Male, Caucasian, Low socioeconomic status, and Living alone  Loss Factors: Decrease in vocational status, Decline in physical health, and Financial problems/change in socioeconomic status  Historical Factors: Impulsivity  Risk Reduction Factors:   Sense of responsibility to family, Living with another person, especially a  relative, Positive social support, Positive therapeutic relationship, and Positive coping skills or problem solving skills  Continued Clinical Symptoms:  Severe Anxiety and/or Agitation Alcohol/Substance Abuse/Dependencies More than one psychiatric diagnosis Unstable or Poor Therapeutic Relationship Previous Psychiatric Diagnoses and Treatments  Cognitive Features That Contribute To Risk:  None    Suicide Risk:  Minimal: No identifiable suicidal ideation.  Patients presenting with no risk factors but with morbid ruminations; may be classified as minimal risk based on the severity of  the depressive symptoms  Plan Of Care/Follow-up recommendations:  Activity:  Resume activity as tolerated Diet:  Regular diet Tests:  Routine testing as requested by PCP Other:  Keep outpatient appointments and remain free from substances.    _ Continue current medications.  Disposition: Home with AA sponsor  Majel GORMAN Ramp, FNP 10/01/2024, 2:07 PM

## 2024-10-17 ENCOUNTER — Other Ambulatory Visit: Payer: Self-pay | Admitting: General Practice

## 2024-10-17 DIAGNOSIS — Z8619 Personal history of other infectious and parasitic diseases: Secondary | ICD-10-CM

## 2025-05-11 ENCOUNTER — Encounter: Admitting: General Practice
